# Patient Record
Sex: Male | Born: 1955 | Race: White | Marital: Married | Smoking: Never smoker
Health system: Southern US, Community
[De-identification: ages and names within clinical notes are randomized; demographics above are authoritative.]

## PROBLEM LIST (undated history)

## (undated) DIAGNOSIS — M1990 Unspecified osteoarthritis, unspecified site: Secondary | ICD-10-CM

## (undated) DIAGNOSIS — I10 Essential (primary) hypertension: Secondary | ICD-10-CM

## (undated) DIAGNOSIS — E119 Type 2 diabetes mellitus without complications: Secondary | ICD-10-CM

## (undated) DIAGNOSIS — Z87442 Personal history of urinary calculi: Secondary | ICD-10-CM

## (undated) DIAGNOSIS — I2510 Atherosclerotic heart disease of native coronary artery without angina pectoris: Secondary | ICD-10-CM

## (undated) HISTORY — PX: OTHER SURGICAL HISTORY: SHX169

## (undated) HISTORY — PX: CARDIAC CATHETERIZATION: SHX172

---

## 2017-12-02 ENCOUNTER — Other Ambulatory Visit: Payer: Self-pay | Admitting: Nurse Practitioner

## 2017-12-02 DIAGNOSIS — T1490XA Injury, unspecified, initial encounter: Secondary | ICD-10-CM

## 2017-12-02 DIAGNOSIS — S8991XD Unspecified injury of right lower leg, subsequent encounter: Secondary | ICD-10-CM

## 2017-12-05 ENCOUNTER — Ambulatory Visit
Admission: RE | Admit: 2017-12-05 | Discharge: 2017-12-05 | Disposition: A | Discharge status: Home / Self Care | Payer: Worker's Compensation | Source: Ambulatory Visit | Attending: Nurse Practitioner | Admitting: Nurse Practitioner

## 2017-12-05 DIAGNOSIS — T1490XA Injury, unspecified, initial encounter: Secondary | ICD-10-CM

## 2017-12-05 DIAGNOSIS — S8991XD Unspecified injury of right lower leg, subsequent encounter: Secondary | ICD-10-CM

## 2018-07-17 IMAGING — MR MR KNEE*R* W/O CM
4 of 6 series · 19 of 40 positions shown · non-contrast
Comparison: None.

CLINICAL DATA: Twisting injury of the knee 11/07/2017. Medial knee
pain.

EXAM:
MRI OF THE RIGHT KNEE WITHOUT CONTRAST
TECHNIQUE: Multiplanar, multisequence MR imaging of the knee was performed. No
intravenous contrast was administered.

[Series 3: PD fat-sat · axial · 4.0mm · 0.31mm/px · z∈[-42,+77]mm · 8 of 28 slices shown (1 of 4)]
[im 1/28]
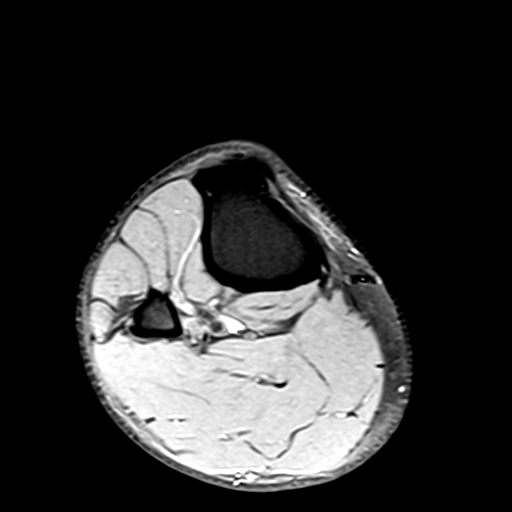
[im 4/28]
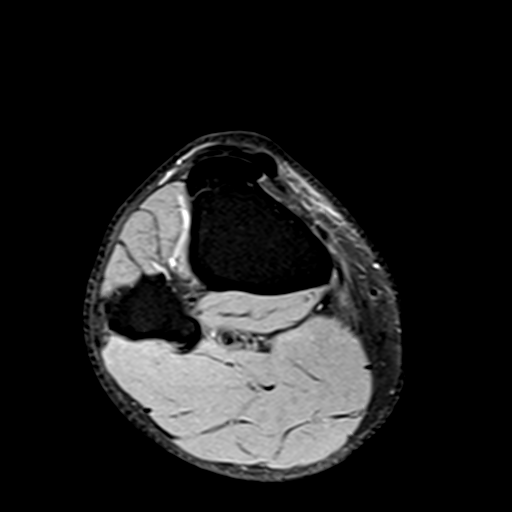
[im 8/28]
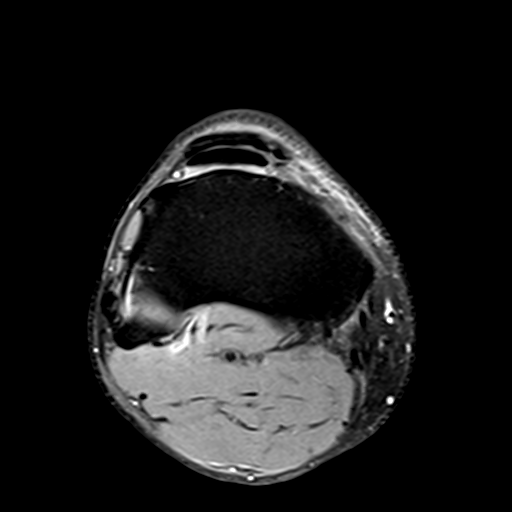
[im 12/28]
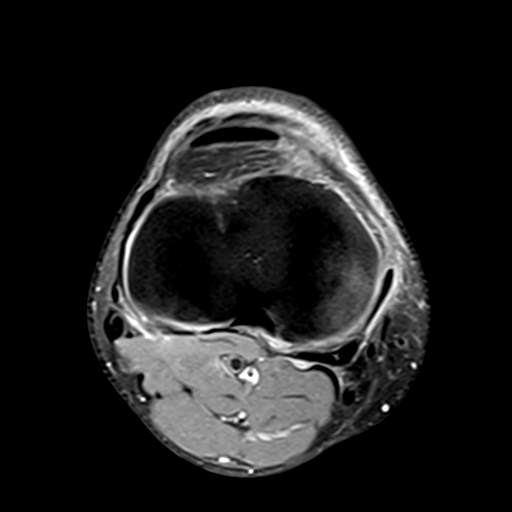
[im 16/28]
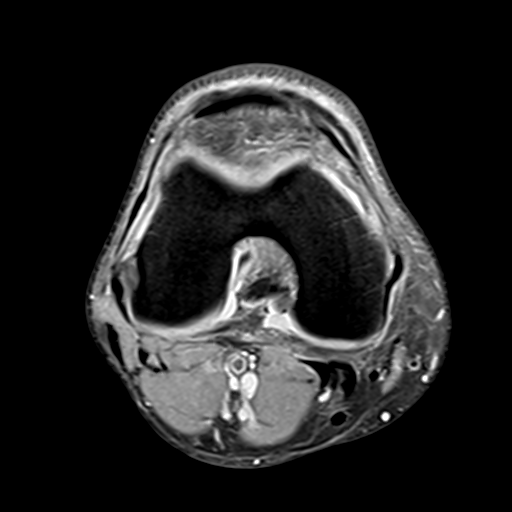
[im 20/28]
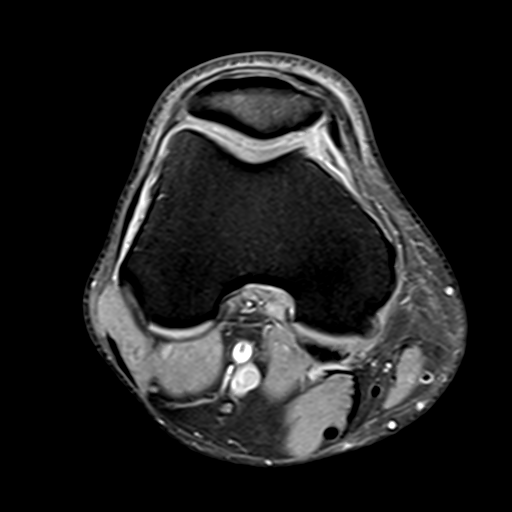
[im 24/28]
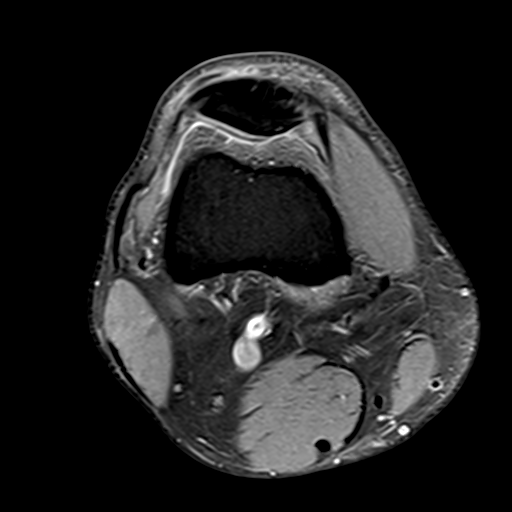
[im 28/28]
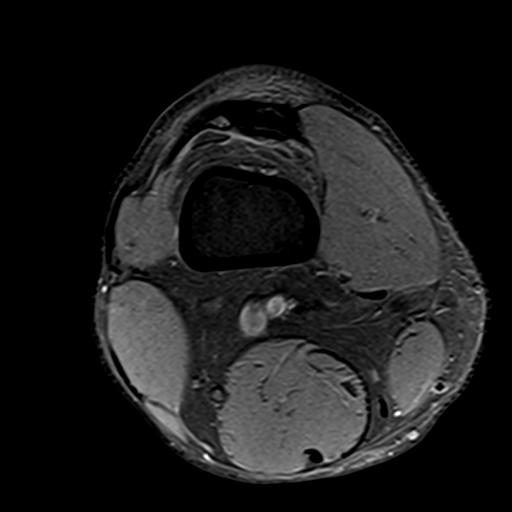

[Series 6: PD fat-sat · coronal · 3.5mm · 0.31mm/px · 5 of 28 slices shown (2 of 4)]
[im 1/28]
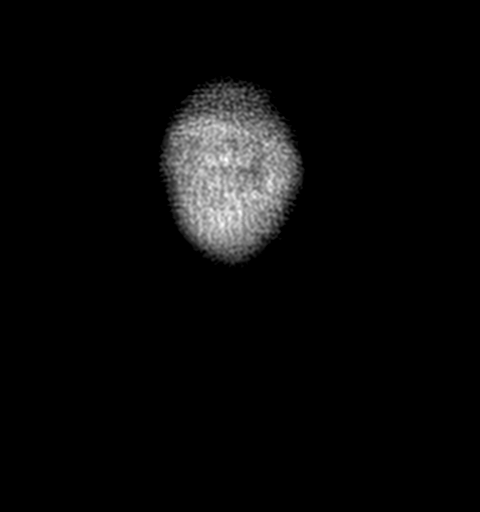
[im 5/28]
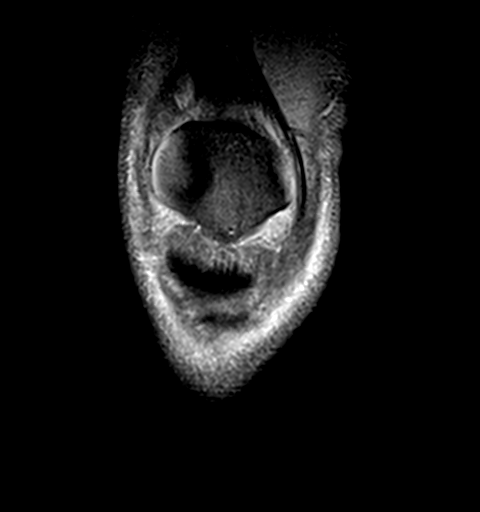
[im 10/28]
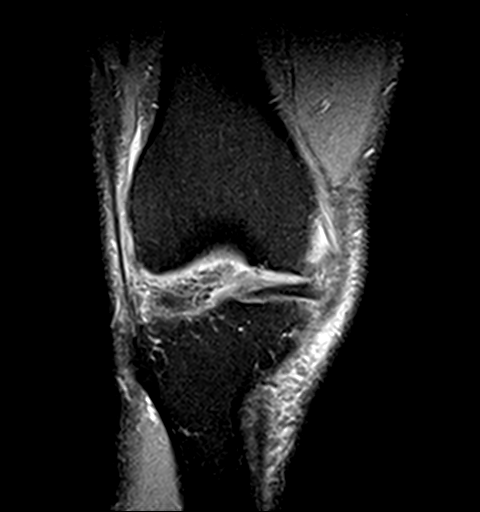
[im 14/28]
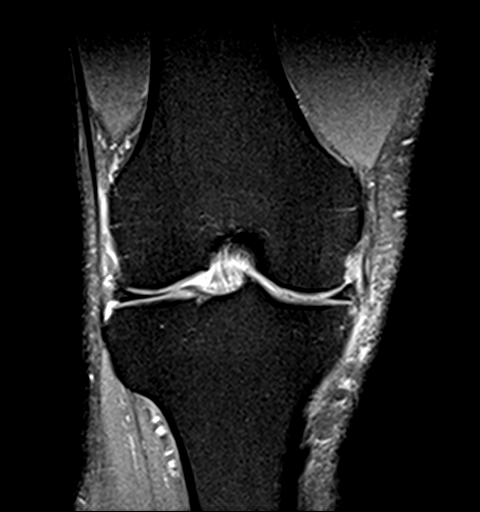
[im 23/28]
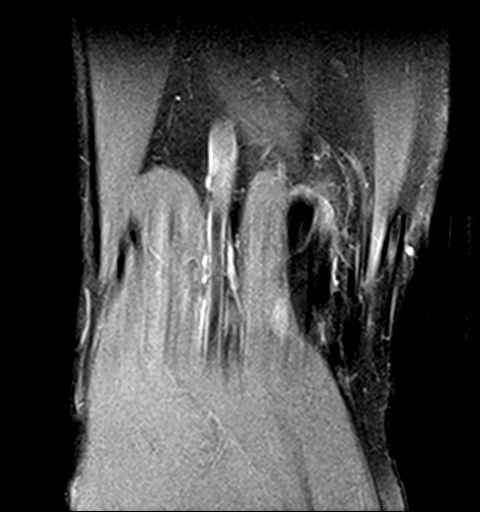

[Series 7: PD fat-sat · sagittal · 3.5mm · 0.31mm/px · 3 of 25 slices shown (3 of 4)]
[im 5/25]
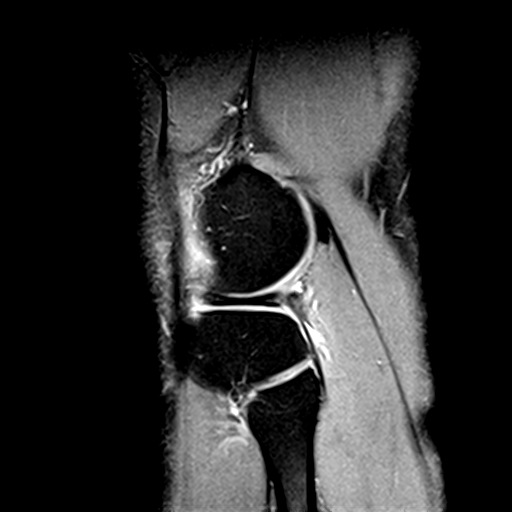
[im 13/25]
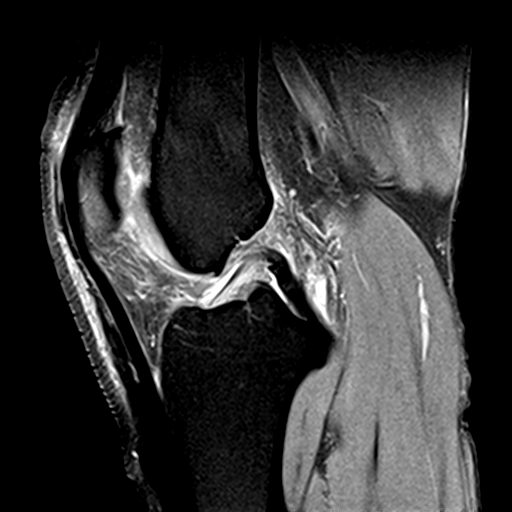
[im 21/25]
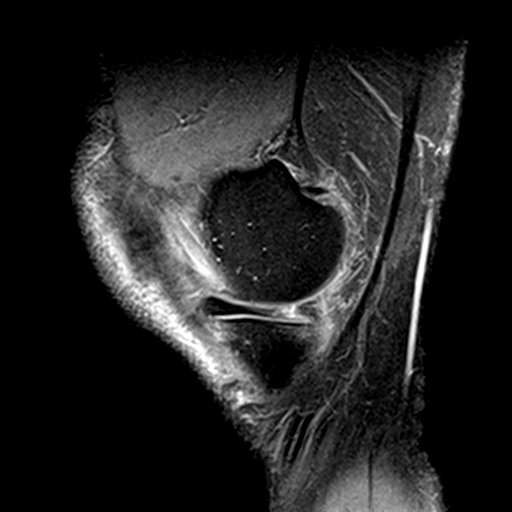

[Series 8: PD fat-sat · coronal · 2.0mm · 0.29mm/px · 3 of 14 slices shown (4 of 4)]
[im 1/14]
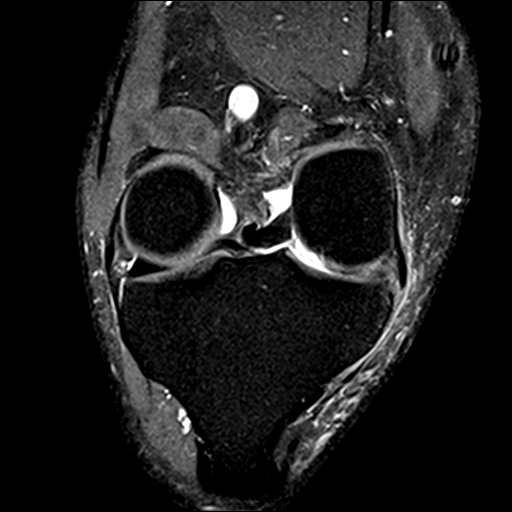
[im 9/14]
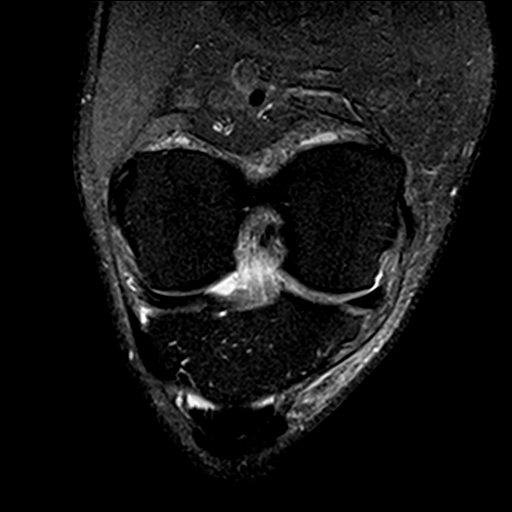
[im 14/14]
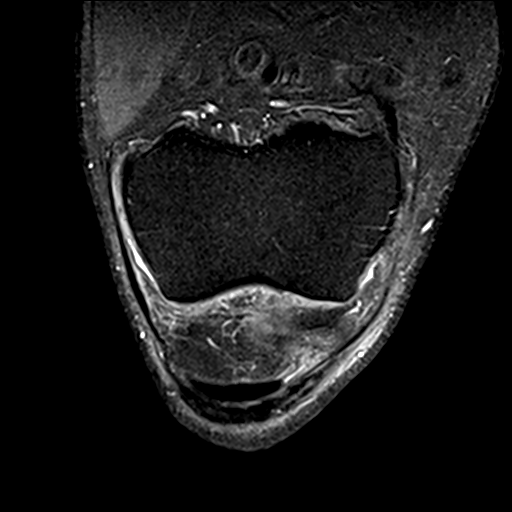

[19 of 40 positions shown; findings below may reference images not displayed]

FINDINGS: MENISCI

Medial meniscus: Complex tear of the posterior horn- body junction
with a radial component. Oblique tear of the posterior horn of the
medial meniscus extending to the inferior articular surface.

Lateral meniscus:  Intact.

LIGAMENTS

Cruciates:  Intact ACL and PCL.

Collaterals: Mild increased signal superficial to the MCL likely
reactive given the meniscal pathology versus mild grade 1 MCL
strain. MCL is otherwise intact. Intact lateral collateral ligament
complex.

CARTILAGE

Patellofemoral: Partial-thickness cartilage loss of the medial
patellofemoral compartment and lateral patellofemoral compartment.

Medial: Partial-thickness cartilage loss of the medial femoral
condyles and medial tibial plateau with areas of full-thickness
cartilage loss along the lateral aspect of the medial femoral
condyle with subchondral reactive marrow changes. Mild subchondral
reactive marrow edema at the periphery of the medial tibial plateau.

Lateral:  No chondral defect.

Joint: No significant joint effusion. Edema in Hoffa's fat. No
plical thickening.

Popliteal Fossa:  Tiny Baker's cyst.  Intact popliteus tendon.

Extensor Mechanism: Intact quadriceps tendon and patellar tendon.
Intact medial and lateral patellar retinaculum. Intact MPFL.

Bones: No acute osseous abnormality. No fracture or dislocation. No
aggressive osseous lesion.

Other: No fluid collection or hematoma.  Muscles are normal.
IMPRESSION: 1. Complex tear of the posterior horn- body junction with a radial
component. Oblique tear of the posterior horn of the medial meniscus
extending to the inferior articular surface.
2. Mild increased signal superficial to the MCL likely reactive
given the meniscal pathology versus mild grade 1 MCL strain. MCL is
otherwise intact.
3. Cartilage abnormalities of the patellofemoral compartment and
medial femorotibial compartment as described above.

## 2020-03-14 NOTE — Progress Notes (Signed)
PCP - Gildardo Griffes   Cardiologist - Tollie Pizza Clearance 02-25-20 care everywhere   Chest x-ray - 04-02-19  [facility] everywhere EKG - 04-02-19 ON chart Stress Test -  ECHO - 03-05-19 care everywhere Cardiac Cath -  HGB a1c 02-16-20 care everywhere    Sleep Study -  CPAP -   Fasting Blood Sugar -  Checks Blood Sugar _____ times a day  Blood Thinner Instructions: Aspirin Instructions: 162 mg sees Dr. Thomasena Edis 3-22 no instructions at preop call  Last Dose:  Anesthesia review: Cardiac stent x1 03-02-2019  Patient denies shortness of breath, fever, cough and chest pain at PAT appointment   NONE   Patient verbalized understanding of instructions that were given to them at the PAT appointment. Patient was also instructed that they will need to review over the PAT instructions again at home before surgery.

## 2020-03-14 NOTE — Patient Instructions (Addendum)
DUE TO COVID-19 ONLY ONE VISITOR IS ALLOWED TO COME WITH YOU AND STAY IN THE WAITING ROOM ONLY DURING PRE OP AND PROCEDURE DAY OF SURGERY. THE 1 VISITOR MAY VISIT WITH YOU AFTER SURGERY IN YOUR PRIVATE ROOM DURING VISITING HOURS ONLY!  10 am -8pm  YOU NEED TO HAVE A COVID 19 TEST ON_3-23-21______ @_2 :15 pm______, THIS TEST MUST BE DONE BEFORE SURGERY, COME  [address], [place] , .  [facility]) ONCE YOUR COVID TEST IS COMPLETED, PLEASE BEGIN THE QUARANTINE INSTRUCTIONS AS OUTLINED IN YOUR HANDOUT.                Robert Esparza  03/14/2020   Your procedure is scheduled on: 03-24-20   Report to [facility] Main  Entrance   Report to admitting at        0800  AM     Call this number if you have problems the morning of surgery [phone]    Remember: Do not eat food or drink liquids :After Midnight.   BRUSH YOUR TEETH MORNING OF SURGERY AND RINSE YOUR MOUTH OUT, NO CHEWING GUM CANDY OR MINTS.     Take these medicines the morning of surgery with A SIP OF WATER: metoprolol, lipitor  DO NOT TAKE ANY DIABETIC MEDICATIONS DAY OF YOUR SURGERY                               You may not have any metal on your body including hair pins and              piercings  Do not wear jewelry, , lotions, powders or perfumes, deodorant              Men may shave face and neck.   Do not bring valuables to the hospital. [facility] IS NOT             RESPONSIBLE   FOR VALUABLES.  Contacts, dentures or bridgework may not be worn into surgery.       Patients discharged the day of surgery will not be allowed to drive home. IF YOU ARE HAVING SURGERY AND GOING HOME THE SAME DAY, YOU MUST HAVE AN ADULT TO DRIVE YOU HOME AND BE WITH YOU FOR 24 HOURS. YOU MAY GO HOME BY TAXI OR UBER OR ORTHERWISE, BUT AN ADULT MUST ACCOMPANY YOU HOME AND STAY WITH YOU FOR 24 HOURS.  Name and phone number of your driver:  Special Instructions: N/A              Please read over the  following fact sheets you were given: _____________________________________________________________________             [facility] - Preparing for Surgery Before surgery, you can play an important role.  Because skin is not sterile, your skin needs to be as free of germs as possible.  You can reduce the number of germs on your skin by washing with CHG (chlorahexidine gluconate) soap before surgery.  CHG is an antiseptic cleaner which kills germs and bonds with the skin to continue killing germs even after washing. Please DO NOT use if you have an allergy to CHG or antibacterial soaps.  If your skin becomes reddened/irritated stop using the CHG and inform your nurse when you arrive at Short Stay. Do not shave (including legs and underarms) for at least 48 hours prior to the first CHG shower.  You  may shave your face/neck. Please follow these instructions carefully:  1.  Shower with CHG Soap the night before surgery and the  morning of Surgery.  2.  If you choose to wash your hair, wash your hair first as usual with your  normal  shampoo.  3.  After you shampoo, rinse your hair and body thoroughly to remove the  shampoo.                           4.  Use CHG as you would any other liquid soap.  You can apply chg directly  to the skin and wash                       Gently with a scrungie or clean washcloth.  5.  Apply the CHG Soap to your body ONLY FROM THE NECK DOWN.   Do not use on face/ open                           Wound or open sores. Avoid contact with eyes, ears mouth and genitals (private parts).                       Wash face,  Genitals (private parts) with your normal soap.             6.  Wash thoroughly, paying special attention to the area where your surgery  will be performed.  7.  Thoroughly rinse your body with warm water from the neck down.  8.  DO NOT shower/wash with your normal soap after using and rinsing off  the CHG Soap.                9.  Pat yourself dry with a clean  towel.            10.  Wear clean pajamas.            11.  Place clean sheets on your bed the night of your first shower and do not  sleep with pets. Day of Surgery : Do not apply any lotions/deodorants the morning of surgery.  Please wear clean clothes to the hospital/surgery center.  FAILURE TO FOLLOW THESE INSTRUCTIONS MAY RESULT IN THE CANCELLATION OF YOUR SURGERY PATIENT SIGNATURE_________________________________  NURSE SIGNATURE__________________________________  ________________________________________________________________________   Robert Esparza  An incentive spirometer is a tool that can help keep your lungs clear and active. This tool measures how well you are filling your lungs with each breath. Taking long deep breaths may help reverse or decrease the chance of developing breathing (pulmonary) problems (especially infection) following:  A long period of time when you are unable to move or be active. BEFORE THE PROCEDURE   If the spirometer includes an indicator to show your best effort, your nurse or respiratory therapist will set it to a desired goal.  If possible, sit up straight or lean slightly forward. Try not to slouch.  Hold the incentive spirometer in an upright position. INSTRUCTIONS FOR USE  1. Sit on the edge of your bed if possible, or sit up as far as you can in bed or on a chair. 2. Hold the incentive spirometer in an upright position. 3. Breathe out normally. 4. Place the mouthpiece in your mouth and seal your lips tightly around it. 5. Breathe in slowly and as deeply as possible, raising the  piston or the ball toward the top of the column. 6. Hold your breath for 3-5 seconds or for as long as possible. Allow the piston or ball to fall to the bottom of the column. 7. Remove the mouthpiece from your mouth and breathe out normally. 8. Rest for a few seconds and repeat Steps 1 through 7 at least 10 times every 1-2 hours when you are awake. Take your  time and take a few normal breaths between deep breaths. 9. The spirometer may include an indicator to show your best effort. Use the indicator as a goal to work toward during each repetition. 10. After each set of 10 deep breaths, practice coughing to be sure your lungs are clear. If you have an incision (the cut made at the time of surgery), support your incision when coughing by placing a pillow or rolled up towels firmly against it. Once you are able to get out of bed, walk around indoors and cough well. You may stop using the incentive spirometer when instructed by your caregiver.  RISKS AND COMPLICATIONS  Take your time so you do not get dizzy or light-headed.  If you are in pain, you may need to take or ask for pain medication before doing incentive spirometry. It is harder to take a deep breath if you are having pain. AFTER USE  Rest and breathe slowly and easily.  It can be helpful to keep track of a log of your progress. Your caregiver can provide you with a simple table to help with this. If you are using the spirometer at home, follow these instructions: [place] IF:   You are having difficultly using the spirometer.  You have trouble using the spirometer as often as instructed.  Your pain medication is not giving enough relief while using the spirometer.  You develop fever of 100.5 F (38.1 C) or higher. SEEK IMMEDIATE MEDICAL CARE IF:   You cough up bloody sputum that had not been present before.  You develop fever of 102 F (38.9 C) or greater.  You develop worsening pain at or near the incision site. MAKE SURE YOU:   Understand these instructions.  Will watch your condition.  Will get help right away if you are not doing well or get worse. Document Released: 04/28/2007 Document Revised: 03/09/2012 Document Reviewed: 06/29/2007 [facility] Patient Information 2014 [place],  [place].   ________________________________________________________________________

## 2020-03-16 ENCOUNTER — Encounter (HOSPITAL_COMMUNITY)
Admission: RE | Admit: 2020-03-16 | Discharge: 2020-03-16 | Disposition: A | Discharge status: Home / Self Care | Payer: 59 | Source: Ambulatory Visit | Attending: Specialist | Admitting: Specialist

## 2020-03-16 ENCOUNTER — Other Ambulatory Visit: Payer: Self-pay

## 2020-03-16 ENCOUNTER — Encounter (HOSPITAL_COMMUNITY): Payer: Self-pay

## 2020-03-16 DIAGNOSIS — Z01812 Encounter for preprocedural laboratory examination: Secondary | ICD-10-CM | POA: Insufficient documentation

## 2020-03-16 HISTORY — DX: Personal history of urinary calculi: Z87.442

## 2020-03-16 HISTORY — DX: Unspecified osteoarthritis, unspecified site: M19.90

## 2020-03-16 HISTORY — DX: Type 2 diabetes mellitus without complications: E11.9

## 2020-03-16 HISTORY — DX: Atherosclerotic heart disease of native coronary artery without angina pectoris: I25.10

## 2020-03-16 HISTORY — DX: Essential (primary) hypertension: I10

## 2020-03-17 ENCOUNTER — Encounter (HOSPITAL_COMMUNITY)
Admission: RE | Admit: 2020-03-17 | Discharge: 2020-03-17 | Disposition: A | Discharge status: Home / Self Care | Payer: 59 | Source: Ambulatory Visit | Attending: Specialist | Admitting: Specialist

## 2020-03-17 DIAGNOSIS — Z01812 Encounter for preprocedural laboratory examination: Secondary | ICD-10-CM | POA: Diagnosis not present

## 2020-03-17 LAB — GLUCOSE, CAPILLARY: Glucose-Capillary: 137 mg/dL — ABNORMAL HIGH (ref 70–99)

## 2020-03-17 LAB — CBC
HCT: 42.6 % (ref 39.0–52.0)
Hemoglobin: 14.9 g/dL (ref 13.0–17.0)
MCH: 32.9 pg (ref 26.0–34.0)
MCHC: 35 g/dL (ref 30.0–36.0)
MCV: 94 fL (ref 80.0–100.0)
Platelets: 217 10*3/uL (ref 150–400)
RBC: 4.53 MIL/uL (ref 4.22–5.81)
RDW: 11.7 % (ref 11.5–15.5)
WBC: 8.3 10*3/uL (ref 4.0–10.5)
nRBC: 0 % (ref 0.0–0.2)

## 2020-03-17 LAB — BASIC METABOLIC PANEL
Anion gap: 11 (ref 5–15)
BUN: 18 mg/dL (ref 8–23)
CO2: 23 mmol/L (ref 22–32)
Calcium: 9.5 mg/dL (ref 8.9–10.3)
Chloride: 101 mmol/L (ref 98–111)
Creatinine, Ser: 0.81 mg/dL (ref 0.61–1.24)
GFR calc Af Amer: >60 mL/min (ref >60)
GFR calc non Af Amer: >60 mL/min (ref >60)
Glucose, Bld: 158 mg/dL — ABNORMAL HIGH (ref 70–99)
Potassium: 4.6 mmol/L (ref 3.5–5.1)
Sodium: 135 mmol/L (ref 135–145)

## 2020-03-17 LAB — SURGICAL PCR SCREEN
MRSA, PCR: NEGATIVE
Staphylococcus aureus: NEGATIVE

## 2020-03-20 NOTE — H&P (Signed)
TOTAL KNEE ADMISSION H&P  Patient is being admitted for left total knee arthroplasty.  Subjective:  Chief Complaint:left knee pain.  HPI: Robert Esparza, 64 y.o. male, has a history of pain and functional disability in the left knee due to arthritis and has failed non-surgical conservative treatments for greater than 12 weeks to includeNSAID's and/or analgesics, corticosteriod injections, viscosupplementation injections, supervised PT with diminished ADL's post treatment and activity modification.  Onset of symptoms was gradual, starting 3 years ago with gradually worsening course since that time. The patient noted prior procedures on the knee to include  arthroscopy and menisectomy on the left knee(s).  Patient currently rates pain in the left knee(s) at 5 out of 10 with activity. Patient has night pain, worsening of pain with activity and weight bearing and pain that interferes with activities of daily living.  Patient has evidence of periarticular osteophytes and joint space narrowing by imaging studies. This patient has had no previous injury. There is no active infection.  There are no problems to display for this patient.  Past Medical History:  Diagnosis Date  . Arthritis   . Coronary artery disease    Cardiac stent x1  . Diabetes mellitus without complication ([place])    type 2  . History of kidney stones   . Hypertension     Past Surgical History:  Procedure Laterality Date  . arthrocscopy     right knee  . CARDIAC CATHETERIZATION     stent x1  . kidney stone removal      No current facility-administered medications for this encounter.   Current Outpatient Medications  Medication Sig Dispense Refill Last Dose  . aspirin EC 81 MG tablet Take 162 mg by mouth daily.     Marland Kitchen atorvastatin (LIPITOR) 80 MG tablet Take 80 mg by mouth daily.     Marland Kitchen lisinopril (ZESTRIL) 40 MG tablet Take 40 mg by mouth daily.     . metFORMIN (GLUCOPHAGE) 500 MG tablet Take 1,000 mg by mouth 2 (two)  times daily with a meal.      . metoprolol succinate (TOPROL-XL) 25 MG 24 hr tablet Take 25 mg by mouth daily.      No Known Allergies  Social History   Tobacco Use  . Smoking status: Never Smoker  . Smokeless tobacco: Never Used  Substance Use Topics  . Alcohol use: Yes    Alcohol/week: 1.0 standard drinks    Types: 1 Cans of beer per week    Comment: occasional    No family history on file.   Review of Systems  Constitutional: Negative.   HENT: Negative.   Eyes: Negative.   Respiratory: Negative.   Cardiovascular: Negative.   Gastrointestinal: Negative.   Endocrine: Negative.   Genitourinary: Negative.   Musculoskeletal: Positive for arthralgias.  Skin: Negative.   Allergic/Immunologic: Negative.   Neurological: Negative.   Hematological: Negative.   Psychiatric/Behavioral: Negative.     Objective:  Physical Exam  Constitutional: He is oriented to person, place, and time. He appears well-developed and well-nourished. No distress.  HENT:  Head: Normocephalic and atraumatic.  Neck: No JVD present. No tracheal deviation present. No thyromegaly present.  Cardiovascular: Normal rate, regular rhythm, normal heart sounds and intact distal pulses. Exam reveals no gallop and no friction rub.  No murmur heard. Respiratory: Effort normal and breath sounds normal. No stridor. No respiratory distress. He has no wheezes. He has no rales. He exhibits no tenderness.  Musculoskeletal:     Cervical back: Normal  range of motion and neck supple.  Lymphadenopathy:    He has no cervical adenopathy.  Neurological: He is alert and oriented to person, place, and time. No cranial nerve deficit. Coordination normal.  Skin: Skin is warm and dry. No rash noted. He is not diaphoretic. No erythema. No pallor.  Psychiatric: He has a normal mood and affect. His behavior is normal. Judgment and thought content normal.    Vital signs in last 24 hours:    Labs:   Estimated body mass index is  32.65 kg/m as calculated from the following:   Height as of 03/17/20: 5\' 6"  (1.676 m).   Weight as of 03/17/20: 91.8 kg.   Imaging Review Plain radiographs demonstrate moderate degenerative joint disease of the left knee(s). The overall alignment ismild varus. The bone quality appears to be good for age and reported activity level.      Assessment/Plan:  End stage arthritis, left knee   The patient history, physical examination, clinical judgment of the provider and imaging studies are consistent with end stage degenerative joint disease of the left knee(s) and total knee arthroplasty is deemed medically necessary. The treatment options including medical management, injection therapy arthroscopy and arthroplasty were discussed at length. The risks and benefits of total knee arthroplasty were presented and reviewed. The risks due to aseptic loosening, infection, stiffness, patella tracking problems, thromboembolic complications and other imponderables were discussed. The patient acknowledged the explanation, agreed to proceed with the plan and consent was signed. Patient is being admitted for inpatient treatment for surgery, pain control, PT, OT, prophylactic antibiotics, VTE prophylaxis, progressive ambulation and ADL's and discharge planning. The patient is planning to be discharged home with home health services    Anticipated LOS equal to or greater than 2 midnights due to - Age 61 and older with one or more of the following:  - Obesity  - Expected need for hospital services (PT, OT, Nursing) required for safe  discharge  - Anticipated need for postoperative skilled nursing care or inpatient rehab  - Active co-morbidities: Diabetes OR   - Unanticipated findings during/Post Surgery: None  - Patient is a high risk of re-admission due to: None

## 2020-03-21 ENCOUNTER — Other Ambulatory Visit (HOSPITAL_COMMUNITY)
Admission: RE | Admit: 2020-03-21 | Discharge: 2020-03-21 | Disposition: A | Discharge status: Home / Self Care | Payer: 59 | Source: Ambulatory Visit | Attending: Specialist | Admitting: Specialist

## 2020-03-21 LAB — SARS CORONAVIRUS 2 (TAT 6-24 HRS): SARS Coronavirus 2: NEGATIVE

## 2020-03-23 MED ORDER — BUPIVACAINE LIPOSOME 1.3 % IJ SUSP
20.0000 mL | Freq: Once | INTRAMUSCULAR | Status: DC
  Filled 2020-03-23: qty 20

## 2020-03-23 NOTE — Anesthesia Preprocedure Evaluation (Addendum)
Anesthesia Evaluation  Patient identified by MRN, date of birth, ID band Patient awake    Reviewed: Allergy & Precautions, NPO status , Patient's Chart, lab work & pertinent test results, reviewed documented beta blocker date and time   History of Anesthesia Complications Negative for: history of anesthetic complications  Airway Mallampati: II  TM Distance: >3 FB Neck ROM: Full    Dental no notable dental hx.    Pulmonary neg pulmonary ROS,    Pulmonary exam normal        Cardiovascular hypertension, Pt. on medications and Pt. on home beta blockers + CAD and + Cardiac Stents  Normal cardiovascular exam     Neuro/Psych negative neurological ROS  negative psych ROS   GI/Hepatic negative GI ROS, Neg liver ROS,   Endo/Other  diabetes, Type 2, Oral Hypoglycemic Agents  Renal/GU negative Renal ROS  negative genitourinary   Musculoskeletal  (+) Arthritis ,   Abdominal   Peds  Hematology negative hematology ROS (+)   Anesthesia Other Findings Day of surgery medications reviewed with patient.  Reproductive/Obstetrics negative OB ROS                            Anesthesia Physical Anesthesia Plan  ASA: III  Anesthesia Plan: Spinal   Post-op Pain Management:  Regional for Post-op pain   Induction:   PONV Risk Score and Plan: 2 and Treatment may vary due to age or medical condition, Ondansetron, Propofol infusion and Dexamethasone  Airway Management Planned: Natural Airway and Simple Face Mask  Additional Equipment: None  Intra-op Plan:   Post-operative Plan:   Informed Consent: I have reviewed the patients History and Physical, chart, labs and discussed the procedure including the risks, benefits and alternatives for the proposed anesthesia with the patient or authorized representative who has indicated his/her understanding and acceptance.       Plan Discussed with:  CRNA  Anesthesia Plan Comments:        Anesthesia Quick Evaluation

## 2020-03-24 ENCOUNTER — Inpatient Hospital Stay (HOSPITAL_COMMUNITY): Payer: 59 | Admitting: Physician Assistant

## 2020-03-24 ENCOUNTER — Inpatient Hospital Stay (HOSPITAL_COMMUNITY): Payer: 59 | Admitting: Certified Registered"

## 2020-03-24 ENCOUNTER — Encounter (HOSPITAL_COMMUNITY)
Admission: RE | Disposition: A | Discharge status: Home / Self Care | Payer: Self-pay | Source: Home / Self Care | Attending: Specialist

## 2020-03-24 ENCOUNTER — Other Ambulatory Visit: Payer: Self-pay

## 2020-03-24 ENCOUNTER — Inpatient Hospital Stay (HOSPITAL_COMMUNITY)
Admission: RE | Admit: 2020-03-24 | Discharge: 2020-03-25 | DRG: 470 | Disposition: A | Discharge status: Home Health | Payer: 59 | Attending: Specialist | Admitting: Specialist

## 2020-03-24 ENCOUNTER — Encounter (HOSPITAL_COMMUNITY): Payer: Self-pay | Admitting: Specialist

## 2020-03-24 DIAGNOSIS — Z79899 Other long term (current) drug therapy: Secondary | ICD-10-CM

## 2020-03-24 DIAGNOSIS — I10 Essential (primary) hypertension: Secondary | ICD-10-CM | POA: Diagnosis present

## 2020-03-24 DIAGNOSIS — E669 Obesity, unspecified: Secondary | ICD-10-CM | POA: Diagnosis present

## 2020-03-24 DIAGNOSIS — Z955 Presence of coronary angioplasty implant and graft: Secondary | ICD-10-CM

## 2020-03-24 DIAGNOSIS — Z6832 Body mass index (BMI) 32.0-32.9, adult: Secondary | ICD-10-CM

## 2020-03-24 DIAGNOSIS — M1712 Unilateral primary osteoarthritis, left knee: Principal | ICD-10-CM | POA: Diagnosis present

## 2020-03-24 DIAGNOSIS — E119 Type 2 diabetes mellitus without complications: Secondary | ICD-10-CM | POA: Diagnosis present

## 2020-03-24 DIAGNOSIS — I2510 Atherosclerotic heart disease of native coronary artery without angina pectoris: Secondary | ICD-10-CM | POA: Diagnosis present

## 2020-03-24 DIAGNOSIS — Z7982 Long term (current) use of aspirin: Secondary | ICD-10-CM

## 2020-03-24 DIAGNOSIS — Z7984 Long term (current) use of oral hypoglycemic drugs: Secondary | ICD-10-CM | POA: Diagnosis not present

## 2020-03-24 DIAGNOSIS — Z20822 Contact with and (suspected) exposure to covid-19: Secondary | ICD-10-CM | POA: Diagnosis present

## 2020-03-24 HISTORY — PX: TOTAL KNEE ARTHROPLASTY: SHX125

## 2020-03-24 LAB — COMPREHENSIVE METABOLIC PANEL
ALT: 34 U/L (ref 0–44)
AST: 24 U/L (ref 15–41)
Albumin: 3.7 g/dL (ref 3.5–5.0)
Alkaline Phosphatase: 29 U/L — ABNORMAL LOW (ref 38–126)
Anion gap: 8 (ref 5–15)
BUN: 19 mg/dL (ref 8–23)
CO2: 26 mmol/L (ref 22–32)
Calcium: 8.9 mg/dL (ref 8.9–10.3)
Chloride: 101 mmol/L (ref 98–111)
Creatinine, Ser: 0.96 mg/dL (ref 0.61–1.24)
GFR calc Af Amer: >60 mL/min (ref >60)
GFR calc non Af Amer: >60 mL/min (ref >60)
Glucose, Bld: 175 mg/dL — ABNORMAL HIGH (ref 70–99)
Potassium: 5.8 mmol/L — ABNORMAL HIGH (ref 3.5–5.1)
Sodium: 135 mmol/L (ref 135–145)
Total Bilirubin: 1.2 mg/dL (ref 0.3–1.2)
Total Protein: 6.5 g/dL (ref 6.5–8.1)

## 2020-03-24 LAB — CREATININE, SERUM
Creatinine, Ser: 0.95 mg/dL (ref 0.61–1.24)
GFR calc Af Amer: >60 mL/min (ref >60)
GFR calc non Af Amer: >60 mL/min (ref >60)

## 2020-03-24 LAB — ABO/RH: ABO/RH(D): AB NEG

## 2020-03-24 LAB — CBC
HCT: 39.5 % (ref 39.0–52.0)
Hemoglobin: 13.6 g/dL (ref 13.0–17.0)
MCH: 32.5 pg (ref 26.0–34.0)
MCHC: 34.4 g/dL (ref 30.0–36.0)
MCV: 94.3 fL (ref 80.0–100.0)
Platelets: 198 10*3/uL (ref 150–400)
RBC: 4.19 MIL/uL — ABNORMAL LOW (ref 4.22–5.81)
RDW: 11.6 % (ref 11.5–15.5)
WBC: 8.1 10*3/uL (ref 4.0–10.5)
nRBC: 0 % (ref 0.0–0.2)

## 2020-03-24 LAB — GLUCOSE, CAPILLARY
Glucose-Capillary: 166 mg/dL — ABNORMAL HIGH (ref 70–99)
Glucose-Capillary: 159 mg/dL — ABNORMAL HIGH (ref 70–99)
Glucose-Capillary: 269 mg/dL — ABNORMAL HIGH (ref 70–99)
Glucose-Capillary: 250 mg/dL — ABNORMAL HIGH (ref 70–99)

## 2020-03-24 LAB — PROTIME-INR
INR: 0.9 (ref 0.8–1.2)
Prothrombin Time: 12.5 seconds (ref 11.4–15.2)

## 2020-03-24 LAB — APTT: aPTT: 26 seconds (ref 24–36)

## 2020-03-24 LAB — HIV ANTIBODY (ROUTINE TESTING W REFLEX): HIV Screen 4th Generation wRfx: NONREACTIVE

## 2020-03-24 LAB — TYPE AND SCREEN
ABO/RH(D): AB NEG
Antibody Screen: NEGATIVE

## 2020-03-24 SURGERY — ARTHROPLASTY, KNEE, TOTAL
Anesthesia: Spinal | Site: Knee | Laterality: Left

## 2020-03-24 MED ORDER — LIDOCAINE 2% (20 MG/ML) 5 ML SYRINGE
INTRAMUSCULAR | Status: DC | PRN
  Administered 2020-03-24: 40 mg via INTRAVENOUS

## 2020-03-24 MED ORDER — PROMETHAZINE HCL 25 MG/ML IJ SOLN: 6.2500 mg | INTRAMUSCULAR | Status: DC | PRN

## 2020-03-24 MED ORDER — ATORVASTATIN CALCIUM 80 MG PO TABS
80.0000 mg | ORAL_TABLET | Freq: Every day | ORAL | Status: DC
  Administered 2020-03-24 – 2020-03-25 (×2): 80 mg via ORAL
  Filled 2020-03-24: qty 1
  Filled 2020-03-24 (×2): qty 2
  Filled 2020-03-24: qty 1

## 2020-03-24 MED ORDER — PHENYLEPHRINE 40 MCG/ML (10ML) SYRINGE FOR IV PUSH (FOR BLOOD PRESSURE SUPPORT)
PREFILLED_SYRINGE | INTRAVENOUS | Status: DC | PRN
  Administered 2020-03-24: 40 ug via INTRAVENOUS
  Administered 2020-03-24 (×2): 80 ug via INTRAVENOUS

## 2020-03-24 MED ORDER — DEXAMETHASONE SODIUM PHOSPHATE 10 MG/ML IJ SOLN
INTRAMUSCULAR | Status: AC
  Filled 2020-03-24: qty 1

## 2020-03-24 MED ORDER — 0.9 % SODIUM CHLORIDE (POUR BTL) OPTIME
TOPICAL | Status: DC | PRN
  Administered 2020-03-24: 1000 mL

## 2020-03-24 MED ORDER — FENTANYL CITRATE (PF) 100 MCG/2ML IJ SOLN: 25.0000 ug | INTRAMUSCULAR | Status: DC | PRN

## 2020-03-24 MED ORDER — PHENYLEPHRINE HCL-NACL 10-0.9 MG/250ML-% IV SOLN
INTRAVENOUS | Status: DC | PRN
  Administered 2020-03-24: 20 ug/min via INTRAVENOUS

## 2020-03-24 MED ORDER — CHLORHEXIDINE GLUCONATE 4 % EX LIQD: 60.0000 mL | Freq: Once | CUTANEOUS | Status: DC

## 2020-03-24 MED ORDER — BUPIVACAINE-EPINEPHRINE (PF) 0.5% -1:200000 IJ SOLN
INTRAMUSCULAR | Status: DC | PRN
  Administered 2020-03-24: 15 mL via PERINEURAL

## 2020-03-24 MED ORDER — MIDAZOLAM HCL 2 MG/2ML IJ SOLN
INTRAMUSCULAR | Status: DC | PRN
  Administered 2020-03-24 (×2): 1 mg via INTRAVENOUS

## 2020-03-24 MED ORDER — SODIUM CHLORIDE 0.9 % IR SOLN
Status: DC | PRN
  Administered 2020-03-24: 1

## 2020-03-24 MED ORDER — PROPOFOL 500 MG/50ML IV EMUL
INTRAVENOUS | Status: DC | PRN
  Administered 2020-03-24: 100 ug/kg/min via INTRAVENOUS

## 2020-03-24 MED ORDER — POVIDONE-IODINE 10 % EX SWAB
2.0000 "application " | Freq: Once | CUTANEOUS | Status: AC
  Administered 2020-03-24: 2 via TOPICAL

## 2020-03-24 MED ORDER — MIDAZOLAM HCL 2 MG/2ML IJ SOLN
INTRAMUSCULAR | Status: AC
  Filled 2020-03-24: qty 2

## 2020-03-24 MED ORDER — CEFAZOLIN SODIUM-DEXTROSE 1-4 GM/50ML-% IV SOLN
1.0000 g | Freq: Three times a day (TID) | INTRAVENOUS | Status: DC
  Administered 2020-03-24 – 2020-03-25 (×2): 1 g via INTRAVENOUS
  Filled 2020-03-24 (×2): qty 50

## 2020-03-24 MED ORDER — METHOCARBAMOL 500 MG PO TABS
500.0000 mg | ORAL_TABLET | Freq: Four times a day (QID) | ORAL | Status: DC | PRN
  Administered 2020-03-25: 500 mg via ORAL
  Filled 2020-03-24: qty 1

## 2020-03-24 MED ORDER — SODIUM CHLORIDE (PF) 0.9 % IJ SOLN
INTRAMUSCULAR | Status: AC
  Filled 2020-03-24: qty 10

## 2020-03-24 MED ORDER — METFORMIN HCL 500 MG PO TABS
1000.0000 mg | ORAL_TABLET | Freq: Two times a day (BID) | ORAL | Status: DC
  Administered 2020-03-25: 1000 mg via ORAL
  Filled 2020-03-24: qty 2

## 2020-03-24 MED ORDER — METHOCARBAMOL 500 MG IVPB - SIMPLE MED
500.0000 mg | Freq: Four times a day (QID) | INTRAVENOUS | Status: DC | PRN
  Filled 2020-03-24: qty 50

## 2020-03-24 MED ORDER — OXYCODONE HCL 5 MG PO TABS
5.0000 mg | ORAL_TABLET | ORAL | Status: DC | PRN
  Administered 2020-03-24: 10 mg via ORAL
  Filled 2020-03-24: qty 1
  Filled 2020-03-24 (×2): qty 2

## 2020-03-24 MED ORDER — DEXAMETHASONE SODIUM PHOSPHATE 10 MG/ML IJ SOLN
8.0000 mg | Freq: Once | INTRAMUSCULAR | Status: AC
  Administered 2020-03-24: 11:00:00 8 mg via INTRAVENOUS

## 2020-03-24 MED ORDER — ONDANSETRON HCL 4 MG PO TABS: 4.0000 mg | ORAL_TABLET | Freq: Every day | ORAL | 1 refills | Status: AC | PRN

## 2020-03-24 MED ORDER — GABAPENTIN 300 MG PO CAPS
300.0000 mg | ORAL_CAPSULE | Freq: Three times a day (TID) | ORAL | Status: DC
  Administered 2020-03-24 – 2020-03-25 (×3): 300 mg via ORAL
  Filled 2020-03-24 (×3): qty 1

## 2020-03-24 MED ORDER — ONDANSETRON HCL 4 MG/2ML IJ SOLN: 4.0000 mg | Freq: Four times a day (QID) | INTRAMUSCULAR | Status: DC | PRN

## 2020-03-24 MED ORDER — DIPHENHYDRAMINE HCL 12.5 MG/5ML PO ELIX: 12.5000 mg | ORAL_SOLUTION | ORAL | Status: DC | PRN

## 2020-03-24 MED ORDER — LISINOPRIL 20 MG PO TABS
40.0000 mg | ORAL_TABLET | Freq: Every day | ORAL | Status: DC
  Administered 2020-03-25: 40 mg via ORAL
  Filled 2020-03-24: qty 2

## 2020-03-24 MED ORDER — MIDAZOLAM HCL 2 MG/2ML IJ SOLN
1.0000 mg | INTRAMUSCULAR | Status: DC
  Administered 2020-03-24: 1 mg via INTRAVENOUS
  Filled 2020-03-24: qty 2

## 2020-03-24 MED ORDER — LIDOCAINE 2% (20 MG/ML) 5 ML SYRINGE
INTRAMUSCULAR | Status: AC
  Filled 2020-03-24: qty 5

## 2020-03-24 MED ORDER — OXYCODONE HCL 5 MG/5ML PO SOLN: 5.0000 mg | Freq: Once | ORAL | Status: DC | PRN

## 2020-03-24 MED ORDER — BUPIVACAINE IN DEXTROSE 0.75-8.25 % IT SOLN
INTRATHECAL | Status: DC | PRN
  Administered 2020-03-24: 1.6 mL via INTRATHECAL

## 2020-03-24 MED ORDER — SODIUM CHLORIDE 0.9 % IV SOLN: INTRAVENOUS | Status: DC

## 2020-03-24 MED ORDER — LACTATED RINGERS IV SOLN: INTRAVENOUS | Status: DC

## 2020-03-24 MED ORDER — ONDANSETRON HCL 4 MG/2ML IJ SOLN
INTRAMUSCULAR | Status: AC
  Filled 2020-03-24: qty 2

## 2020-03-24 MED ORDER — ONDANSETRON HCL 4 MG PO TABS: 4.0000 mg | ORAL_TABLET | Freq: Four times a day (QID) | ORAL | Status: DC | PRN

## 2020-03-24 MED ORDER — PROPOFOL 1000 MG/100ML IV EMUL
INTRAVENOUS | Status: AC
  Filled 2020-03-24: qty 100

## 2020-03-24 MED ORDER — OXYCODONE HCL 5 MG PO TABS: 5.0000 mg | ORAL_TABLET | Freq: Once | ORAL | Status: DC | PRN

## 2020-03-24 MED ORDER — MAGNESIUM CITRATE PO SOLN: 1.0000 | Freq: Once | ORAL | Status: DC | PRN

## 2020-03-24 MED ORDER — FENTANYL CITRATE (PF) 100 MCG/2ML IJ SOLN
50.0000 ug | INTRAMUSCULAR | Status: DC
  Administered 2020-03-24: 50 ug via INTRAVENOUS
  Filled 2020-03-24: qty 2

## 2020-03-24 MED ORDER — TRANEXAMIC ACID-NACL 1000-0.7 MG/100ML-% IV SOLN
1000.0000 mg | INTRAVENOUS | Status: AC
  Administered 2020-03-24: 1000 mg via INTRAVENOUS
  Filled 2020-03-24: qty 100

## 2020-03-24 MED ORDER — PROPOFOL 10 MG/ML IV BOLUS
INTRAVENOUS | Status: AC
  Filled 2020-03-24: qty 20

## 2020-03-24 MED ORDER — METHOCARBAMOL 500 MG PO TABS: 500.0000 mg | ORAL_TABLET | Freq: Four times a day (QID) | ORAL | 0 refills | Status: AC

## 2020-03-24 MED ORDER — HYDROMORPHONE HCL 1 MG/ML IJ SOLN: 0.5000 mg | INTRAMUSCULAR | Status: DC | PRN

## 2020-03-24 MED ORDER — PROPOFOL 10 MG/ML IV BOLUS
INTRAVENOUS | Status: DC | PRN
  Administered 2020-03-24: 20 mg via INTRAVENOUS

## 2020-03-24 MED ORDER — METOPROLOL SUCCINATE ER 25 MG PO TB24
25.0000 mg | ORAL_TABLET | Freq: Every day | ORAL | Status: DC
  Administered 2020-03-25: 25 mg via ORAL
  Filled 2020-03-24: qty 1

## 2020-03-24 MED ORDER — OXYCODONE HCL 5 MG PO TABS: 5.0000 mg | ORAL_TABLET | Freq: Three times a day (TID) | ORAL | 0 refills | Status: AC | PRN

## 2020-03-24 MED ORDER — ACETAMINOPHEN 325 MG PO TABS: 325.0000 mg | ORAL_TABLET | Freq: Four times a day (QID) | ORAL | Status: DC | PRN

## 2020-03-24 MED ORDER — SODIUM CHLORIDE 0.9 % IV SOLN
INTRAVENOUS | Status: DC | PRN
  Administered 2020-03-24: 80 mL

## 2020-03-24 MED ORDER — SODIUM CHLORIDE (PF) 0.9 % IJ SOLN
INTRAMUSCULAR | Status: AC
  Filled 2020-03-24: qty 50

## 2020-03-24 MED ORDER — ACETAMINOPHEN 500 MG PO TABS
1000.0000 mg | ORAL_TABLET | Freq: Four times a day (QID) | ORAL | Status: AC
  Administered 2020-03-24 – 2020-03-25 (×4): 1000 mg via ORAL
  Filled 2020-03-24 (×4): qty 2

## 2020-03-24 MED ORDER — SENNOSIDES-DOCUSATE SODIUM 8.6-50 MG PO TABS: 1.0000 | ORAL_TABLET | Freq: Every evening | ORAL | Status: DC | PRN

## 2020-03-24 MED ORDER — BISACODYL 5 MG PO TBEC: 5.0000 mg | DELAYED_RELEASE_TABLET | Freq: Every day | ORAL | Status: DC | PRN

## 2020-03-24 MED ORDER — CEFAZOLIN SODIUM-DEXTROSE 2-4 GM/100ML-% IV SOLN
2.0000 g | INTRAVENOUS | Status: AC
  Administered 2020-03-24: 2 g via INTRAVENOUS
  Filled 2020-03-24: qty 100

## 2020-03-24 MED ORDER — ACETAMINOPHEN 500 MG PO TABS
1000.0000 mg | ORAL_TABLET | Freq: Once | ORAL | Status: AC
  Administered 2020-03-24: 1000 mg via ORAL
  Filled 2020-03-24: qty 2

## 2020-03-24 MED ORDER — TRAMADOL HCL 50 MG PO TABS
50.0000 mg | ORAL_TABLET | Freq: Four times a day (QID) | ORAL | Status: DC
  Administered 2020-03-24 – 2020-03-25 (×3): 50 mg via ORAL
  Filled 2020-03-24 (×4): qty 1

## 2020-03-24 MED ORDER — OXYCODONE HCL 5 MG PO TABS
10.0000 mg | ORAL_TABLET | ORAL | Status: DC | PRN
  Administered 2020-03-25: 10 mg via ORAL

## 2020-03-24 MED ORDER — ENOXAPARIN SODIUM 40 MG/0.4ML ~~LOC~~ SOLN
40.0000 mg | SUBCUTANEOUS | Status: DC
  Administered 2020-03-25: 40 mg via SUBCUTANEOUS
  Filled 2020-03-24: qty 0.4

## 2020-03-24 SURGICAL SUPPLY — 70 items
ATTUNE PS FEM LT SZ 6 CEM KNEE (Femur) ×3 IMPLANT
ATTUNE PSRP INSR SZ6 6 KNEE (Insert) ×2 IMPLANT
ATTUNE PSRP INSR SZ6 6MM KNEE (Insert) ×1 IMPLANT
BAG ZIPLOCK 12X15 (MISCELLANEOUS) ×6 IMPLANT
BASE TIBIAL ROT PLAT SZ 7 KNEE (Knees) ×1 IMPLANT
BLADE SAG 18X100X1.27 (BLADE) ×3 IMPLANT
BLADE SAW SGTL 11.0X1.19X90.0M (BLADE) ×3 IMPLANT
BNDG ELASTIC 4X5.8 VLCR STR LF (GAUZE/BANDAGES/DRESSINGS) ×3 IMPLANT
BNDG ELASTIC 6X10 VLCR STRL LF (GAUZE/BANDAGES/DRESSINGS) ×3 IMPLANT
BNDG ELASTIC 6X5.8 VLCR STR LF (GAUZE/BANDAGES/DRESSINGS) ×3 IMPLANT
BONE CEMENT GENTAMICIN (Cement) ×6 IMPLANT
BOWL SMART MIX CTS (DISPOSABLE) ×3 IMPLANT
CEMENT BONE GENTAMICIN 40 (Cement) ×2 IMPLANT
COVER SURGICAL LIGHT HANDLE (MISCELLANEOUS) ×3 IMPLANT
COVER WAND RF STERILE (DRAPES) IMPLANT
CUFF TOURN SGL QUICK 34 (TOURNIQUET CUFF) ×2
CUFF TRNQT CYL 34X4.125X (TOURNIQUET CUFF) ×1 IMPLANT
DECANTER SPIKE VIAL GLASS SM (MISCELLANEOUS) ×3 IMPLANT
DERMABOND ADVANCED (GAUZE/BANDAGES/DRESSINGS) ×2
DERMABOND ADVANCED .7 DNX12 (GAUZE/BANDAGES/DRESSINGS) ×1 IMPLANT
DRAPE U-SHAPE 47X51 STRL (DRAPES) ×3 IMPLANT
DRESSING AQUACEL AG SP 3.5X10 (GAUZE/BANDAGES/DRESSINGS) ×1 IMPLANT
DRSG AQUACEL AG ADV 3.5X10 (GAUZE/BANDAGES/DRESSINGS) ×3 IMPLANT
DRSG AQUACEL AG SP 3.5X10 (GAUZE/BANDAGES/DRESSINGS) ×3
DRSG TEGADERM 4X4.75 (GAUZE/BANDAGES/DRESSINGS) ×3 IMPLANT
DURAPREP 26ML APPLICATOR (WOUND CARE) ×6 IMPLANT
ELECT REM PT RETURN 15FT ADLT (MISCELLANEOUS) ×3 IMPLANT
EVACUATOR 1/8 PVC DRAIN (DRAIN) ×3 IMPLANT
GAUZE SPONGE 2X2 8PLY STRL LF (GAUZE/BANDAGES/DRESSINGS) ×1 IMPLANT
GLOVE BIOGEL PI IND STRL 7.5 (GLOVE) ×1 IMPLANT
GLOVE BIOGEL PI IND STRL 8 (GLOVE) ×1 IMPLANT
GLOVE BIOGEL PI INDICATOR 7.5 (GLOVE) ×2
GLOVE BIOGEL PI INDICATOR 8 (GLOVE) ×2
GLOVE ECLIPSE 8.0 STRL XLNG CF (GLOVE) ×3 IMPLANT
GLOVE SURG ORTHO 9.0 STRL STRW (GLOVE) ×3 IMPLANT
GLOVE SURG SS PI 7.0 STRL IVOR (GLOVE) ×3 IMPLANT
GOWN STRL REUS W/TWL XL LVL3 (GOWN DISPOSABLE) ×6 IMPLANT
HANDPIECE INTERPULSE COAX TIP (DISPOSABLE) ×2
HOLDER FOLEY CATH W/STRAP (MISCELLANEOUS) IMPLANT
JET LAVAGE IRRISEPT WOUND (IRRIGATION / IRRIGATOR) ×3
KIT TURNOVER KIT A (KITS) IMPLANT
LAVAGE JET IRRISEPT WOUND (IRRIGATION / IRRIGATOR) ×1 IMPLANT
NS IRRIG 1000ML POUR BTL (IV SOLUTION) ×3 IMPLANT
PACK TOTAL KNEE CUSTOM (KITS) ×3 IMPLANT
PATELLA MEDIAL ATTUN 35MM KNEE (Knees) ×3 IMPLANT
PENCIL SMOKE EVACUATOR (MISCELLANEOUS) IMPLANT
PIN DRILL FIX HALF THREAD (BIT) ×3 IMPLANT
PIN STEINMAN FIXATION KNEE (PIN) ×3 IMPLANT
PROTECTOR NERVE ULNAR (MISCELLANEOUS) ×3 IMPLANT
SET HNDPC FAN SPRY TIP SCT (DISPOSABLE) ×1 IMPLANT
SET PAD KNEE POSITIONER (MISCELLANEOUS) ×3 IMPLANT
SPONGE GAUZE 2X2 STER 10/PKG (GAUZE/BANDAGES/DRESSINGS) ×2
SPONGE LAP 18X18 RF (DISPOSABLE) IMPLANT
SPONGE SURGIFOAM ABS GEL 100 (HEMOSTASIS) ×3 IMPLANT
STOCKINETTE 6  STRL (DRAPES) ×2
STOCKINETTE 6 STRL (DRAPES) ×1 IMPLANT
SUT BONE WAX W31G (SUTURE) IMPLANT
SUT MNCRL AB 3-0 PS2 18 (SUTURE) ×3 IMPLANT
SUT VIC AB 1 CT1 27 (SUTURE) ×6
SUT VIC AB 1 CT1 27XBRD ANTBC (SUTURE) ×3 IMPLANT
SUT VIC AB 2-0 CT1 27 (SUTURE) ×6
SUT VIC AB 2-0 CT1 TAPERPNT 27 (SUTURE) ×3 IMPLANT
SUT VLOC 180 0 24IN GS25 (SUTURE) ×9 IMPLANT
SYR 50ML LL SCALE MARK (SYRINGE) IMPLANT
TAPE STRIPS DRAPE STRL (GAUZE/BANDAGES/DRESSINGS) ×3 IMPLANT
TIBIAL BASE ROT PLAT SZ 7 KNEE (Knees) ×3 IMPLANT
TRAY CATH 16FR W/PLASTIC CATH (SET/KITS/TRAYS/PACK) ×3 IMPLANT
WATER STERILE IRR 1000ML POUR (IV SOLUTION) ×6 IMPLANT
WRAP KNEE MAXI GEL POST OP (GAUZE/BANDAGES/DRESSINGS) ×3 IMPLANT
YANKAUER SUCT BULB TIP 10FT TU (MISCELLANEOUS) ×3 IMPLANT

## 2020-03-24 NOTE — Anesthesia Procedure Notes (Signed)
Anesthesia Regional Block: Adductor canal block   Pre-Anesthetic Checklist: ,, timeout performed, Correct Patient, Correct Site, Correct Laterality, Correct Procedure, Correct Position, site marked, Risks and benefits discussed, pre-op evaluation,  At surgeon's request and post-op pain management  Laterality: Left  Prep: Maximum Sterile Barrier Precautions used, chloraprep       Needles:  Injection technique: Single-shot  Needle Type: Echogenic Stimulator Needle     Needle Length: 9cm  Needle Gauge: 22     Additional Needles:   Procedures:,,,, ultrasound used (permanent image in chart),,,,  Narrative:  Start time: 03/24/2020 9:34 AM End time: 03/24/2020 9:37 AM Injection made incrementally with aspirations every 5 mL.  Performed by: Personally  Anesthesiologist: Kaylyn Layer, MD  Additional Notes: Risks, benefits, and alternative discussed. Patient gave consent for procedure. Patient prepped and draped in sterile fashion. Sedation administered, patient remains easily responsive to voice. Relevant anatomy identified with ultrasound guidance. Local anesthetic given in 5cc increments with no signs or symptoms of intravascular injection. No pain or paraesthesias with injection. Patient monitored throughout procedure with signs of LAST or immediate complications. Tolerated well. Ultrasound image placed in chart.  Robert Greenhouse, MD

## 2020-03-24 NOTE — Anesthesia Procedure Notes (Signed)
Procedure Name: MAC Date/Time: 03/24/2020 10:33 AM Performed by: Eben Burow, CRNA Pre-anesthesia Checklist: Patient identified, Emergency Drugs available, Suction available, Patient being monitored and Timeout performed Oxygen Delivery Method: Simple face mask Dental Injury: Teeth and Oropharynx as per pre-operative assessment

## 2020-03-24 NOTE — Transfer of Care (Signed)
Immediate Anesthesia Transfer of Care Note  Patient: Declan Mier  Procedure(s) Performed: TOTAL KNEE ARTHROPLASTY (Left Knee)  Patient Location: PACU  Anesthesia Type:Spinal  Level of Consciousness: awake, alert  and oriented  Airway & Oxygen Therapy: Patient Spontanous Breathing and Patient connected to face mask oxygen  Post-op Assessment: Report given to RN and Post -op Vital signs reviewed and stable  Post vital signs: Reviewed and stable  Last Vitals:  Vitals Value Taken Time  BP 101/56 03/24/20 1255  Temp    Pulse 73 03/24/20 1257  Resp 13 03/24/20 1257  SpO2 100 % 03/24/20 1257  Vitals shown include unvalidated device data.  Last Pain:  Vitals:   03/24/20 0820  TempSrc: Oral         Complications: No apparent anesthesia complications

## 2020-03-24 NOTE — Evaluation (Addendum)
Physical Therapy Evaluation Patient Details Name: Robert Esparza MRN: [id number] DOB: Aug 20, 1956 Today's Date: 03/24/2020   History of Present Illness  L TKA 03/24/20. PMH of HTN, DM2, CAD.  Clinical Impression  Pt is s/p TKA resulting in the deficits listed below (see PT Problem List). Pt ambulated 60' with RW, loss of balance x 1 requiring min A to recover. Initiated TKA HEP. Good progress expected.  Pt will benefit from skilled PT to increase their independence and safety with mobility to allow discharge to the venue listed below.      Follow Up Recommendations Follow surgeon's recommendation for DC plan and follow-up therapies    Equipment Recommendations  None recommended by PT    Recommendations for Other Services       Precautions / Restrictions  knee, fall precautions Reviewed no pillow under knee WBAT     Mobility  Bed Mobility Overal bed mobility: Modified Independent             General bed mobility comments: HOB up, used rail  Transfers Overall transfer level: Needs assistance Equipment used: Rolling walker (2 wheeled) Transfers: Sit to/from Stand Sit to Stand: Min assist;From elevated surface         General transfer comment: VCs hand placement, min A to power up  Ambulation/Gait Ambulation/Gait assistance: Min guard;Min assist Gait Distance (Feet): 45 Feet Assistive device: Rolling walker (2 wheeled) Gait Pattern/deviations: Step-to pattern;Decreased stride length;Decreased weight shift to left Gait velocity: decr   General Gait Details: VCs sequencing, mild LOB x 1 while turning required min A to recover  Stairs            Wheelchair Mobility    Modified Rankin (Stroke Patients Only)       Balance Overall balance assessment: Needs assistance   Sitting balance-Leahy Scale: Good     Standing balance support: Bilateral upper extremity supported Standing balance-Leahy Scale: Poor Standing balance comment: relies on BUE  support                             Pertinent Vitals/Pain Pain Assessment: 0-10 Pain Score: 1  Pain Location: L knee Pain Descriptors / Indicators: Sore Pain Intervention(s): Limited activity within patient's tolerance;Monitored during session;Ice applied    Home Living Family/patient expects to be discharged to:: Private residence Living Arrangements: Spouse/significant other Available Help at Discharge: Family   Home Access: Stairs to enter Entrance Stairs-Rails: (has post to hold on to) Technical brewer of Steps: 3 Home Layout: One level Home Equipment: [place] - 2 wheels;Bedside commode;Cane - single point      Prior Function Level of Independence: Independent               Hand Dominance        Extremity/Trunk Assessment   Upper Extremity Assessment Upper Extremity Assessment: Overall WFL for tasks assessed    Lower Extremity Assessment Lower Extremity Assessment: LLE deficits/detail LLE Deficits / Details: SLR 3/5, knee AAROM 0-50* LLE Sensation: decreased light touch(feet decr to light touch likely 2* spinal) LLE Coordination: WNL    Cervical / Trunk Assessment Cervical / Trunk Assessment: Normal  Communication   Communication: No difficulties  Cognition Arousal/Alertness: Awake/alert Behavior During Therapy: WFL for tasks assessed/performed Overall Cognitive Status: Within Functional Limits for tasks assessed  General Comments      Exercises Total Joint Exercises Ankle Circles/Pumps: AROM;Both;10 reps;Supine Heel Slides: AAROM;Left;Supine;15 reps Long Arc Quad: AROM;Left;10 reps;Seated Goniometric ROM: 0-50* AAROM L knee   Assessment/Plan    PT Assessment Patient needs continued PT services  PT Problem List Decreased strength;Decreased range of motion;Decreased activity tolerance;Decreased balance;Decreased mobility       PT Treatment Interventions Gait  training;DME instruction;Stair training;Functional mobility training;Therapeutic exercise;Therapeutic activities;Patient/family education    PT Goals (Current goals can be found in the Care Plan section)  Acute Rehab PT Goals Patient 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: "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" PT Goal Formulation: With patient/family Time For Goal Achievement: 03/31/20 Potential to Achieve Goals: Good    Frequency 7X/week   Barriers to discharge        Co-evaluation               AM-PAC PT "6 Clicks" Mobility  Outcome Measure Help needed turning from your back to your side while in a flat bed without using bedrails?: None Help needed moving from lying on your back to sitting on the side of a flat bed without using bedrails?: A Little Help needed moving to and from a bed to a chair (including a wheelchair)?: A Little Help needed standing up from a chair using your arms (e.g., wheelchair or bedside chair)?: A Little Help needed to walk in hospital room?: A Little Help needed climbing 3-5 steps with a railing? : A Lot 6 Click Score: 18    End of Session Equipment Utilized During Treatment: Gait belt Activity Tolerance: Patient tolerated treatment well;No increased pain Patient left: in chair;with call bell/phone within reach;with chair alarm set;with family/visitor present Nurse Communication: Mobility status PT Visit Diagnosis: Unsteadiness on feet (R26.81);Difficulty in walking, not elsewhere classified (R26.2);Muscle weakness (generalized) (M62.81)    Time: 6761-9509 PT Time Calculation (min) (ACUTE ONLY): 30 min   Charges:   PT Evaluation $PT Eval Low Complexity: 1 Low PT Treatments $Gait Training: 8-22 mins        Ralene Bathe Kistler PT 03/24/2020  Acute Rehabilitation Services Pager [phone] Office [phone]

## 2020-03-24 NOTE — Anesthesia Procedure Notes (Signed)
Spinal  Patient location during procedure: OR Start time: 03/24/2020 10:32 AM End time: 03/24/2020 10:35 AM Staffing Performed: anesthesiologist  Anesthesiologist: Kaylyn Layer, MD Preanesthetic Checklist Completed: patient identified, IV checked, risks and benefits discussed, surgical consent, monitors and equipment checked, pre-op evaluation and timeout performed Spinal Block Patient position: sitting Prep: DuraPrep and site prepped and draped Patient monitoring: continuous pulse ox, blood pressure and heart rate Approach: midline Location: L3-4 Injection technique: single-shot Needle Needle type: Pencan  Needle gauge: 24 G Needle length: 9 cm Additional Notes Risks, benefits, and alternative discussed. Patient gave consent to procedure. Prepped and draped in sitting position. Patient sedated but responsive to voice. Clear CSF obtained after one needle pass. Positive terminal aspiration. No pain or paraesthesias with injection. Patient tolerated procedure well. Vital signs stable. Amalia Greenhouse, MD

## 2020-03-24 NOTE — Progress Notes (Signed)
Assisted Dr. Howse with left, ultrasound guided, adductor canal block. Side rails up, monitors on throughout procedure. See vital signs in flow sheet. Tolerated Procedure well.  

## 2020-03-24 NOTE — Interval H&P Note (Signed)
History and Physical Interval Note:  03/24/2020 9:49 AM  Robert Esparza  has presented today for surgery, with the diagnosis of Left knee osteoarthritis.  The various methods of treatment have been discussed with the patient and family. After consideration of risks, benefits and other options for treatment, the patient has consented to  Procedure(s) with comments: TOTAL KNEE ARTHROPLASTY (Left) - adductor canal as a surgical intervention.  The patient's history has been reviewed, patient examined, no change in status, stable for surgery.  I have reviewed the patient's chart and labs.  Questions were answered to the patient's satisfaction.     Wilborn Membreno ANDREW

## 2020-03-24 NOTE — Op Note (Signed)
DATE OF SURGERY:  03/24/2020  TIME: 12:26 PM  PATIENT NAME:  Robert Esparza    AGE: 64 y.o.   PRE-OPERATIVE DIAGNOSIS:  Left knee osteoarthritis  POST-OPERATIVE DIAGNOSIS:  Left knee osteoarthritis  PROCEDURE:  Procedure(s): TOTAL KNEE ARTHROPLASTY  SURGEON:  Clay Menser ANDREW  ASSISTANT:  Leeanne Haus, PA-C, present and scrubbed throughout the case, critical for assistance with exposure, retraction, instrumentation, and closure.  OPERATIVE IMPLANTS: Depuy PFC Attune Rotating Platform.  Femur size 6, Tibia size 7, Patella size 35 3-peg oval button, with a 6 mm polyethylene insert.   PREOPERATIVE INDICATIONS:   [name] is a 64 y.o. year old male with end stage bone on bone arthritis of the knee who failed conservative treatment and elected for Total Knee Arthroplasty.   The risks, benefits, and alternatives were discussed at length including but not limited to the risks of infection, bleeding, nerve injury, stiffness, blood clots, the need for revision surgery, cardiopulmonary complications, among others, and they were willing to proceed.  OPERATIVE DESCRIPTION:  The patient was brought to the operative room and placed in a supine position.  Spinal anesthesia was administered.  IV antibiotics were given.  The lower extremity was prepped and draped in the usual sterile fashion.  Time out was performed.  The leg was elevated and exsanguinated and the tourniquet was inflated.  Anterior quadriceps tendon splitting approach was performed.  The patella was retracted and osteophytes were removed.  The anterior horn of the medial and lateral meniscus was removed and cruciate ligaments resected.   The distal femur was opened with the drill and the intramedullary distal femoral cutting jig was utilized, set at 5 degrees resecting 10 mm off the distal femur.  Care was taken to protect the collateral ligaments.  The distal femoral sizing jig was applied, taking care to avoid  notching.  Then the 4-in-1 cutting jig was applied and the anterior and posterior femur was cut, along with the chamfer cuts.    Then the extramedullary tibial cutting jig was utilized making the appropriate cut using the anterior tibial crest as a reference building in appropriate posterior slope.  Care was taken during the cut to protect the medial and collateral ligaments.  The proximal tibia was removed along with the posterior horns of the menisci.   The posterior medial femoral osteophytes and posterior lateral femoral osteophytes were removed.    The flexion gap was then measured and was symmetric with the extension gap, measured at 6.  I completed the distal femoral preparation using the appropriate jig to prepare the box.  The patella was then measured, and cut with the saw.    The proximal tibia sized and prepared accordingly with the reamer and the punch, and then all components were trialed with the trial insert.  The knee was found to have excellent balance and full motion.    The above named components were then cemented into place and all excess cement was removed.  The trial polyethylene component was in place during cementation, and then was exchanged for the real polyethylene component.    The knee was easily taken through a range of motion and the patella tracked well and the knee irrigated copiously and the parapatellar and subcutaneous tissue closed with vicryl, and monocryl with steri strips for the skin.  The arthrotomy was closed at 90 of flexion. The wounds were dressed with sterile gauze and the tourniquet released and the patient was awakened and returned to the PACU in stable and  satisfactory condition.  There were no complications.  Total tourniquet time was 86 minutes.                6

## 2020-03-25 LAB — GLUCOSE, CAPILLARY: Glucose-Capillary: 172 mg/dL — ABNORMAL HIGH (ref 70–99)

## 2020-03-25 NOTE — Discharge Summary (Signed)
Patient ID: Robert Esparza MRN: [id number] DOB/AGE: 1956-09-14 64 y.o.  Admit date: 03/24/2020 Discharge date:   Primary Diagnosis: Left knee OA  Admission Diagnoses:  Past Medical History:  Diagnosis Date  . Arthritis   . Coronary artery disease    Cardiac stent x1  . Diabetes mellitus without complication (HCC)    type 2  . History of kidney stones   . Hypertension    Discharge Diagnoses:   Active Problems:   Osteoarthritis of left knee  Estimated body mass index is 32.65 kg/m as calculated from the following:   Height as of this encounter: 5\' 6"  (1.676 m).   Weight as of this encounter: 91.8 kg.  Procedure:  Procedure(s) (LRB): TOTAL KNEE ARTHROPLASTY (Left)   Consults: None  HPI: Mr. Robert Esparza was admitted for operative management of his end-stage left knee osteoarthritis. Laboratory Data: Admission on 03/24/2020  Component Date Value Ref Range Status  . Prothrombin Time 03/24/2020 12.5  11.4 - 15.2 seconds Final  . INR 03/24/2020 0.9  0.8 - 1.2 Final   Comment: (NOTE) INR goal varies based on device and disease states. Performed at [facility], [address], [place], [place]   . aPTT 03/24/2020 26  24 - 36 seconds Final   Performed at [facility] [address] [place], [place]  . ABO/RH(D) 03/24/2020 AB NEG   Final  . Antibody Screen 03/24/2020 NEG   Final  . Sample Expiration 03/24/2020    Final                   Value:03/27/2020,2359 Performed at [facility], [address] [place], [place]   . Glucose-Capillary 03/24/2020 166* 70 - 99 mg/dL Final   Glucose reference range applies only to samples taken after fasting for at least 8 hours.  . Comment 1 03/24/2020 Notify RN   Final  . Comment 2 03/24/2020 Document in Chart   Final  . ABO/RH(D) 03/24/2020    Final                   Value:AB NEG Performed at [facility], [address]  [place], [place]   . HIV Screen 4th Generation wRfx 03/24/2020 NON REACTIVE  NON REACTIVE Final   Performed at [facility], [address] [place], [place]  . WBC 03/24/2020 8.1  4.0 - 10.5 K/uL Final  . RBC 03/24/2020 4.19* 4.22 - 5.81 MIL/uL Final  . Hemoglobin 03/24/2020 13.6  13.0 - 17.0 g/dL Final  . HCT 03/26/2020 39.5  39.0 - 52.0 % Final  . MCV 03/24/2020 94.3  80.0 - 100.0 fL Final  . MCH 03/24/2020 32.5  26.0 - 34.0 pg Final  . MCHC 03/24/2020 34.4  30.0 - 36.0 g/dL Final  . RDW 03/26/2020 11.6  11.5 - 15.5 % Final  . Platelets 03/24/2020 198  150 - 400 K/uL Final  . nRBC 03/24/2020 0.0  0.0 - 0.2 % Final   Performed at [facility], [address], [place], [place]  . Creatinine, Ser 03/24/2020 0.95  0.61 - 1.24 mg/dL Final  . GFR calc non Af Amer 03/24/2020 >60  >60 mL/min Final  . GFR calc Af Amer 03/24/2020 >60  >60 mL/min Final   Performed at [facility], [address], [place], [place]  . Glucose-Capillary 03/24/2020 159* 70 - 99 mg/dL Final  Glucose reference range applies only to samples taken after fasting for at least 8 hours.  . Sodium 03/24/2020 135  135 - 145 mmol/L Final  . Potassium 03/24/2020 5.8* 3.5 - 5.1 mmol/L Final  . Chloride 03/24/2020 101  98 - 111 mmol/L Final  . CO2 03/24/2020 26  22 - 32 mmol/L Final  . Glucose, Bld 03/24/2020 175* 70 - 99 mg/dL Final   Glucose reference range applies only to samples taken after fasting for at least 8 hours.  . BUN 03/24/2020 19  8 - 23 mg/dL Final  . Creatinine, Ser 03/24/2020 0.96  0.61 - 1.24 mg/dL Final  . Calcium 03/24/2020 8.9  8.9 - 10.3 mg/dL Final  . Total Protein 03/24/2020 6.5  6.5 - 8.1 g/dL Final  . Albumin 03/24/2020 3.7  3.5 - 5.0 g/dL Final  . AST 03/24/2020 24  15 - 41 U/L Final  . ALT 03/24/2020 34  0 - 44 U/L Final  . Alkaline Phosphatase 03/24/2020 29* 38 - 126 U/L Final  . Total Bilirubin 03/24/2020 1.2  0.3 -  1.2 mg/dL Final  . GFR calc non Af Amer 03/24/2020 >60  >60 mL/min Final  . GFR calc Af Amer 03/24/2020 >60  >60 mL/min Final  . Anion gap 03/24/2020 8  5 - 15 Final   Performed at [facility], [place] [address] [place], [place] [address]  . Glucose-Capillary 03/24/2020 269* 70 - 99 mg/dL Final   Glucose reference range applies only to samples taken after fasting for at least 8 hours.  . Glucose-Capillary 03/24/2020 250* 70 - 99 mg/dL Final   Glucose reference range applies only to samples taken after fasting for at least 8 hours.  . Glucose-Capillary 03/25/2020 172* 70 - 99 mg/dL Final   Glucose reference range applies only to samples taken after fasting for at least 8 hours.  Hospital Outpatient Visit on 03/21/2020  Component Date Value Ref Range Status  . SARS Coronavirus 2 03/21/2020 NEGATIVE  NEGATIVE Final   Comment: (NOTE) SARS-CoV-2 target nucleic acids are NOT DETECTED. The SARS-CoV-2 RNA is generally detectable in upper and lower respiratory specimens during the acute phase of infection. Negative results do not preclude SARS-CoV-2 infection, do not rule out co-infections with other pathogens, and should not be used as the sole basis for treatment or other patient management decisions. Negative results must be combined with clinical observations, patient history, and epidemiological information. The expected result is Negative. Fact Sheet for Patients: SugarRoll.be Fact Sheet for Healthcare Providers: https://www.woods-mathews.com/ This test is not yet approved or cleared by the [place] FDA and  has been authorized for detection and/or diagnosis of SARS-CoV-2 by FDA under an Emergency Use Authorization (EUA). This EUA will remain  in effect (meaning this test can be used) for the duration of the COVID-19 declaration under Section 56                          4(b)(1) of the Act, 21 U.S.C. section 360bbb-3(b)(1),  unless the authorization is terminated or revoked sooner. Performed at [facility], [place] [address] [place], [place] [address]   Hospital Outpatient Visit on 03/17/2020  Component Date Value Ref Range Status  . MRSA, PCR 03/17/2020 NEGATIVE  NEGATIVE Final  . Staphylococcus aureus 03/17/2020 NEGATIVE  NEGATIVE Final   Comment: (NOTE) The Xpert SA Assay (FDA approved for NASAL specimens in patients 52 years of age and older), is one component of  a comprehensive surveillance program. It is not intended to diagnose infection nor to guide or monitor treatment. Performed at [facility], [address], [place], [place] [address]   . Sodium 03/17/2020 135  135 - 145 mmol/L Final  . Potassium 03/17/2020 4.6  3.5 - 5.1 mmol/L Final  . Chloride 03/17/2020 101  98 - 111 mmol/L Final  . CO2 03/17/2020 23  22 - 32 mmol/L Final  . Glucose, Bld 03/17/2020 158* 70 - 99 mg/dL Final   Glucose reference range applies only to samples taken after fasting for at least 8 hours.  . BUN 03/17/2020 18  8 - 23 mg/dL Final  . Creatinine, Ser 03/17/2020 0.81  0.61 - 1.24 mg/dL Final  . Calcium 60/45/4098 9.5  8.9 - 10.3 mg/dL Final  . GFR calc non Af Amer 03/17/2020 >60  >60 mL/min Final  . GFR calc Af Amer 03/17/2020 >60  >60 mL/min Final  . Anion gap 03/17/2020 11  5 - 15 Final   Performed at [facility], [address] [place], [place] [address]  . WBC 03/17/2020 8.3  4.0 - 10.5 K/uL Final  . RBC 03/17/2020 4.53  4.22 - 5.81 MIL/uL Final  . Hemoglobin 03/17/2020 14.9  13.0 - 17.0 g/dL Final  . HCT 78/29/5621 42.6  39.0 - 52.0 % Final  . MCV 03/17/2020 94.0  80.0 - 100.0 fL Final  . MCH 03/17/2020 32.9  26.0 - 34.0 pg Final  . MCHC 03/17/2020 35.0  30.0 - 36.0 g/dL Final  . RDW 30/86/5784 11.7  11.5 - 15.5 % Final  . Platelets 03/17/2020 217  150 - 400 K/uL Final  . nRBC 03/17/2020 0.0  0.0 - 0.2 % Final   Performed at [facility], [address] [place], [place] [address]  . Glucose-Capillary 03/17/2020 137* 70 - 99 mg/dL Final   Glucose reference range applies only to samples taken after fasting for at least 8 hours.     X-Rays:No results found.  EKG:No orders found for this or any previous visit.   Hospital Course: Vineeth Fell is a 64 y.o. who was admitted to Hospital. They were brought to the operating room on 03/24/2020 and underwent Procedure(s): TOTAL KNEE ARTHROPLASTY.  Patient tolerated the procedure well and was later transferred to the recovery room and then to the orthopaedic floor for postoperative care.  They were given PO and IV analgesics for pain control following their surgery.  They were given 24 hours of postoperative antibiotics of  Anti-infectives (From admission, onward)   Start     Dose/Rate Route Frequency Ordered Stop   03/24/20 1800  ceFAZolin (ANCEF) IVPB 1 g/50 mL premix     1 g 100 mL/hr over 30 Minutes Intravenous Every 8 hours 03/24/20 1013 03/25/20 2159   03/24/20 0815  ceFAZolin (ANCEF) IVPB 2g/100 mL premix     2 g 200 mL/hr over 30 Minutes Intravenous On call to O.R. 03/24/20 0800 03/24/20 1036     and started on DVT prophylaxis in the form of Lovenox.   PT and OT were ordered for total joint protocol.  Discharge planning consulted to help with postop disposition and equipment needs.  Patient had a good night on the evening of surgery.  They started to get up OOB with therapy on day one. Hemovac drain was pulled without difficulty.  Continued to work with therapy,  Incision was healing well.  Patient was seen in rounds and was ready  to go home.   Diet: Regular diet Activity:WBAT Follow-up:in 10 days Disposition - Home Discharged Condition: good   Discharge Instructions    Call MD / Call 911   Complete by: As directed    If you experience chest pain or shortness of breath, CALL 911 and be transported to the hospital emergency room.  If you develope a fever above 101 F, pus  (white drainage) or increased drainage or redness at the wound, or calf pain, call your surgeon's office.   Call MD / Call 911   Complete by: As directed    If you experience chest pain or shortness of breath, CALL 911 and be transported to the hospital emergency room.  If you develope a fever above 101 F, pus (white drainage) or increased drainage or redness at the wound, or calf pain, call your surgeon's office.   Constipation Prevention   Complete by: As directed    Drink plenty of fluids.  Prune juice may be helpful.  You may use a stool softener, such as Colace (over the counter) 100 mg twice a day.  Use MiraLax (over the counter) for constipation as needed.   Constipation Prevention   Complete by: As directed    Drink plenty of fluids.  Prune juice may be helpful.  You may use a stool softener, such as Colace (over the counter) 100 mg twice a day.  Use MiraLax (over the counter) for constipation as needed.   Diet - low sodium heart healthy   Complete by: As directed    Discharge instructions   Complete by: As directed    Dr. Eugenia Mcalpineobert [facility] [address] [place], [place] [address] [phone]  TOTAL KNEE REPLACEMENT POSTOPERATIVE DIRECTIONS  Knee Rehabilitation, Guidelines Following Surgery  Results after knee surgery are often greatly improved when you follow the exercise, range of motion and muscle strengthening exercises prescribed by your doctor. Safety measures are also important to protect the knee from further injury. Any time any of these exercises cause you to have increased pain or swelling in your knee joint, decrease the amount until you are comfortable again and slowly increase them. If you have problems or questions, call your caregiver or physical therapist for advice.   HOME CARE INSTRUCTIONS  Remove items at home which could result in a fall. This includes throw rugs or furniture in walking pathways.  ICE to the affected knee every three  hours for 30 minutes at a time and then as needed for pain and swelling.  Continue to use ice on the knee for pain and swelling from surgery. You may notice swelling that will progress down to the foot and ankle.  This is normal after surgery.  Elevate the leg when you are not up walking on it.   Continue to use the breathing machine which will help keep your temperature down.  It is common for your temperature to cycle up and down following surgery, especially at night when you are not up moving around and exerting yourself.  The breathing machine keeps your lungs expanded and your temperature down. Do not place pillow under knee, focus on keeping the knee straight while resting   DIET You may resume your previous home diet once your are discharged from the hospital.  DRESSING / WOUND CARE / SHOWERING Keep the surgical dressing until follow up.  The dressing is water proof, but you need to put extra covering over it like plastic wrap.  IF THE  DRESSING FALLS OFF or the wound gets wet inside, change the dressing with sterile gauze.  Please use good hand washing techniques before changing the dressing.  Do not use any lotions or creams on the incision until instructed by your surgeon.   You may start showering once you are discharged home but do not submerge the incision under water.  You are sent home with an ACE bandage on over the leg, this can be removed at 3 days from surgery. At this time you can start showering. Please place the white TED stocking on the surgical leg after. This needs to be worn on the surgical leg for 2 weeks after surgery.   ACTIVITY Walk with your walker as instructed. Use walker as long as suggested by your caregivers. Avoid periods of inactivity such as sitting longer than an hour when not asleep. This helps prevent blood clots.  You may resume a sexual relationship in one month or when given the OK by your doctor.  You may return to work once you are cleared by your  doctor.  Do not drive a car for 6 weeks or until released by you surgeon.  Do not drive while taking narcotics.  WEIGHT BEARING Weight bearing as tolerated with assist device (walker, cane, etc) as directed, use it as long as suggested by your surgeon or therapist, typically at least 4-6 weeks.  POSTOPERATIVE CONSTIPATION PROTOCOL Constipation - defined medically as fewer than three stools per week and severe constipation as less than one stool per week.  One of the most common issues patients have following surgery is constipation.  Even if you have a regular bowel pattern at home, your normal regimen is likely to be disrupted due to multiple reasons following surgery.  Combination of anesthesia, postoperative narcotics, change in appetite and fluid intake all can affect your bowels.  In order to avoid complications following surgery, here are some recommendations in order to help you during your recovery period.  Colace (docusate) - Pick up an over-the-counter form of Colace or another stool softener and take twice a day as long as you are requiring postoperative pain medications.  Take with a full glass of water daily.  If you experience loose stools or diarrhea, hold the colace until you stool forms back up.  If your symptoms do not get better within 1 week or if they get worse, check with your doctor.  Dulcolax (bisacodyl) - Pick up over-the-counter and take as directed by the product packaging as needed to assist with the movement of your bowels.  Take with a full glass of water.  Use this product as needed if not relieved by Colace only.   MiraLax (polyethylene glycol) - Pick up over-the-counter to have on hand.  MiraLax is a solution that will increase the amount of water in your bowels to assist with bowel movements.  Take as directed and can mix with a glass of water, juice, soda, coffee, or tea.  Take if you go more than two days without a movement. Do not use MiraLax more than once per  day. Call your doctor if you are still constipated or irregular after using this medication for 7 days in a row.  If you continue to have problems with postoperative constipation, please contact the office for further assistance and recommendations.  If you experience "the worst abdominal pain ever" or develop nausea or vomiting, please contact the office immediatly for further recommendations for treatment.  ITCHING  If you experience itching  with your medications, try taking only a single pain pill, or even half a pain pill at a time.  You can also use Benadryl over the counter for itching or also to help with sleep.   TED HOSE STOCKINGS Wear the elastic stockings on both legs for two weeks following surgery during the day but you may remove then at night for sleeping.  Okay to remove ACE in 3 days, put TED on after  MEDICATIONS See your medication summary on the "After Visit Summary" that the nursing staff will review with you prior to discharge.  You may have some home medications which will be placed on hold until you complete the course of blood thinner medication.  It is important for you to complete the blood thinner medication as prescribed by your surgeon.  Continue your approved medications as instructed at time of discharge. If you were not previously taking any blood thinners prior to surgery please start taking Aspirin 325 mg tabs twice daily for 6 weeks. If you are unable to take Aspirin please let your doctor know.   PRECAUTIONS If you experience chest pain or shortness of breath - call 911 immediately for transfer to the hospital emergency department.  If you develop a fever greater that 101 F, purulent drainage from wound, increased redness or drainage from wound, foul odor from the wound/dressing, or calf pain - CONTACT YOUR SURGEON.                                                   FOLLOW-UP APPOINTMENTS Make sure you keep all of your appointments after your operation with  your surgeon and caregivers. You should call the office at the above phone number and make an appointment for approximately two weeks after the date of your surgery or on the date instructed by your surgeon outlined in the "After Visit Summary".   RANGE OF MOTION AND STRENGTHENING EXERCISES  Rehabilitation of the knee is important following a knee injury or an operation. After just a few days of immobilization, the muscles of the thigh which control the knee become weakened and shrink (atrophy). Knee exercises are designed to build up the tone and strength of the thigh muscles and to improve knee motion. Often times heat used for twenty to thirty minutes before working out will loosen up your tissues and help with improving the range of motion but do not use heat for the first two weeks following surgery. These exercises can be done on a training (exercise) mat, on the floor, on a table or on a bed. Use what ever works the best and is most comfortable for you Knee exercises include:  Leg Lifts - While your knee is still immobilized in a splint or cast, you can do straight leg raises. Lift the leg to 60 degrees, hold for 3 sec, and slowly lower the leg. Repeat 10-20 times 2-3 times daily. Perform this exercise against resistance later as your knee gets better.  Quad and Hamstring Sets - Tighten up the muscle on the front of the thigh (Quad) and hold for 5-10 sec. Repeat this 10-20 times hourly. Hamstring sets are done by pushing the foot backward against an object and holding for 5-10 sec. Repeat as with quad sets.  Leg Slides: Lying on your back, slowly slide your foot toward your buttocks, bending your  knee up off the floor (only go as far as is comfortable). Then slowly slide your foot back down until your leg is flat on the floor again. Angel Wings: Lying on your back spread your legs to the side as far apart as you can without causing discomfort.  A rehabilitation program following serious knee injuries  can speed recovery and prevent re-injury in the future due to weakened muscles. Contact your doctor or a physical therapist for more information on knee rehabilitation.   IF YOU ARE TRANSFERRED TO A SKILLED REHAB FACILITY If the patient is transferred to a skilled rehab facility following release from the hospital, a list of the current medications will be sent to the facility for the patient to continue.  When discharged from the skilled rehab facility, please have the facility set up the patient's Home Health Physical Therapy prior to being released. Also, the skilled facility will be responsible for providing the patient with their medications at time of release from the facility to include their pain medication, the muscle relaxants, and their blood thinner medication. If the patient is still at the rehab facility at time of the two week follow up appointment, the skilled rehab facility will also need to assist the patient in arranging follow up appointment in our office and any transportation needs.  MAKE SURE YOU:  Understand these instructions.  Get help right away if you are not doing well or get worse.    Pick up stool softner and laxative for home use following surgery while on pain medications. May shower starting three days after surgery. Please use a clean towel to pat the leg dry following showers. Continue to use ice for pain and swelling after surgery. Do not use any lotions or creams on the incision until instructed by you Start Aspirin immediately following surgery.   Do not put a pillow under the knee. Place it under the heel.   Complete by: As directed    Driving restrictions   Complete by: As directed    No driving for two weeks   Increase activity slowly as tolerated   Complete by: As directed    TED hose   Complete by: As directed    Use stockings (TED hose) for three weeks on both leg(s).  You may remove them at night for sleeping.   Weight bearing as tolerated    Complete by: As directed      Allergies as of 03/25/2020   No Known Allergies     Medication List    STOP taking these medications   aspirin EC 81 MG tablet     TAKE these medications   atorvastatin 80 MG tablet Commonly known as: LIPITOR Take 80 mg by mouth daily.   lisinopril 40 MG tablet Commonly known as: ZESTRIL Take 40 mg by mouth daily.   metFORMIN 500 MG tablet Commonly known as: GLUCOPHAGE Take 1,000 mg by mouth 2 (two) times daily with a meal.   methocarbamol 500 MG tablet Commonly known as: Robaxin Take 1 tablet (500 mg total) by mouth 4 (four) times daily.   metoprolol succinate 25 MG 24 hr tablet Commonly known as: TOPROL-XL Take 25 mg by mouth daily.   ondansetron 4 MG tablet Commonly known as: Zofran Take 1 tablet (4 mg total) by mouth daily as needed for nausea or vomiting.   oxyCODONE 5 MG immediate release tablet Commonly known as: Roxicodone Take 1 tablet (5 mg total) by mouth every 8 (eight) hours as needed.  Discharge Care Instructions  (From admission, onward)         Start     Ordered   03/24/20 0000  Weight bearing as tolerated     03/24/20 1401           Signed: Maryan Rued, MD Orthopaedic Surgery 03/25/2020, 8:46 AM

## 2020-03-25 NOTE — TOC Progression Note (Signed)
Transition of Care [facility]) - Progression Note    Patient Details  Name: Robert Esparza MRN: [id number] Date of Birth: Feb 16, 1956  Transition of [facility] CM/SW Contact  Armanda Heritage, RN Phone Number: 03/25/2020, 1:54 PM  Clinical Narrative:  CM spoke with patient at bedside. Referral made to [facility] for HHPT needs. Patient has [employer] and [facility] Ivan will be arranging [facility] agency for patient. All required documents faxed to [facility] at [phone].  Patient reports he has all dme at home.      Expected Discharge Plan: Home w Home Health Services Barriers to Discharge: No Barriers Identified  Expected Discharge Plan and Services Expected Discharge Plan: Home w Home Health Services   Discharge Planning Services: CM Consult   Living arrangements for the past 2 months: Single Family Home Expected Discharge Date: 03/25/20               DME Arranged: N/A DME Agency: NA       HH Arranged: PT HH Agency: Other - See comment([facility] referral made)         Social Determinants of Health (SDOH) Interventions    Readmission Risk Interventions No flowsheet data found.

## 2020-03-25 NOTE — Progress Notes (Signed)
   Subjective:  Patient reports pain as mild to moderate.  States that he had a really nice night.  He certainly has some pain but no acute events.  Pain well controlled.  No nausea vomiting or chest pain.  Objective:   VITALS:   Vitals:   03/24/20 1736 03/24/20 2214 03/25/20 0141 03/25/20 0634  BP: 139/76 134/72 (!) 144/67 (!) 149/66  Pulse: 70 70 62 (!) 55  Resp: 17 16 14 16   Temp: (!) 96.3 F (35.7 C) 98.7 F (37.1 C) 97.8 F (36.6 C) 97.7 F (36.5 C)  TempSrc:  Oral Oral Oral  SpO2: 96% 97% 98% 98%  Weight:      Height:        Neurovascular intact Sensation intact distally Intact pulses distally Dorsiflexion/Plantar flexion intact Incision: dressing C/D/I Compartment soft Hemovac pulled   Lab Results  Component Value Date   WBC 8.1 03/24/2020   HGB 13.6 03/24/2020   HCT 39.5 03/24/2020   MCV 94.3 03/24/2020   PLT 198 03/24/2020   BMET    Component Value Date/Time   NA 135 03/24/2020 1306   K 5.8 (H) 03/24/2020 1306   CL 101 03/24/2020 1306   CO2 26 03/24/2020 1306   GLUCOSE 175 (H) 03/24/2020 1306   BUN 19 03/24/2020 1306   CREATININE 0.95 03/24/2020 1306   CREATININE 0.96 03/24/2020 1306   CALCIUM 8.9 03/24/2020 1306   GFRNONAA >60 03/24/2020 1306   GFRNONAA >60 03/24/2020 1306   GFRAA >60 03/24/2020 1306   GFRAA >60 03/24/2020 1306     Assessment/Plan: 1 Day Post-Op   Active Problems:   Osteoarthritis of left knee   Advance diet Up with therapy  -He did very well with his therapy session yesterday.  Anticipate discharge home later today once he clears stairs.  -Follow-up with Dr. 03/26/2020 2 weeks postop.   Thomasena Edis 03/25/2020, 8:45 AM   03/27/2020, MD [phone]

## 2020-03-25 NOTE — Progress Notes (Signed)
Patient provided dc instructions; dc instructions explained in detail to patient; patient verbalizes understanding; Patient escorted to lobby in wheel chair to be discharged home with spouse.

## 2020-03-25 NOTE — Progress Notes (Signed)
Physical Therapy Treatment Patient Details Name: Robert Esparza MRN: [id number] DOB: March 23, 1956 Today's Date: 03/25/2020    History of Present Illness L TKA 03/24/20. PMH of HTN, DM2, CAD.    PT Comments    Pt is POD # 1 and is progressing well.  Pt demonstrates safe gait & transfers in order to return home from PT perspective once discharged by MD.  While in hospital, will continue to benefit from PT for skilled therapy to advance mobility and exercises.  Pt was able to ambulate 200' with RW and supervision and perform stairs with min guard for safety.  Required cues and educated on exercise and transfer technique.  Performed AAROM exercises with use of gait belt to assist.      Follow Up Recommendations  Follow surgeon's recommendation for DC plan and follow-up therapies;Supervision/Assistance - 24 hour     Equipment Recommendations  None recommended by PT    Recommendations for Other Services       Precautions / Restrictions Precautions Precautions: Fall;Knee Restrictions Other Position/Activity Restrictions: WBAT    Mobility  Bed Mobility Overal bed mobility: Modified Independent             General bed mobility comments: HOB up  Transfers Overall transfer level: Needs assistance Equipment used: Rolling walker (2 wheeled) Transfers: Sit to/from Stand Sit to Stand: Supervision         General transfer comment: initial verbal cues for hand placement; on subsequent sit to stand no cues needed  Ambulation/Gait Ambulation/Gait assistance: Supervision Gait Distance (Feet): 200 Feet Assistive device: Rolling walker (2 wheeled) Gait Pattern/deviations: Decreased stride length;Decreased weight shift to left Gait velocity: decr   General Gait Details: step to and cued to progress to Partial step through pattern; cues for RW proximity; steady no LOB   Stairs Stairs: Yes Stairs assistance: Min guard Stair Management: One rail Left;Step to pattern Number of  Stairs: 5     Wheelchair Mobility    Modified Rankin (Stroke Patients Only)       Balance Overall balance assessment: Needs assistance Sitting-balance support: No upper extremity supported Sitting balance-Leahy Scale: Normal     Standing balance support: No upper extremity supported;During functional activity Standing balance-Leahy Scale: Good Standing balance comment: able to pull up underwear in standing without UE support                            Cognition Arousal/Alertness: Awake/alert Behavior During Therapy: WFL for tasks assessed/performed Overall Cognitive Status: Within Functional Limits for tasks assessed                                        Exercises Total Joint Exercises Ankle Circles/Pumps: AROM;Both;10 reps;Supine Quad Sets: AROM;Supine;Both;10 reps Towel Squeeze: AROM;Both;10 reps;Supine Heel Slides: AAROM;Left;10 reps;Supine Hip ABduction/ADduction: AAROM;Left;10 reps;Supine Straight Leg Raises: AROM;Left;10 reps;Supine Long Arc Quad: AROM;Left;10 reps;Seated Knee Flexion: AROM;Left;10 reps;Seated Goniometric ROM: 0-80 L knee ROM    General Comments General comments (skin integrity, edema, etc.): Pt was educated on no pivots, resting with leg straight (no pillow under knee), car transfers, stairs, HEP, safe ice use, recommendation to move every 1-2 hours, in addition to gait and mobility.      Pertinent Vitals/Pain Pain Assessment: 0-10 Pain Score: 6  Pain Location: L knee Pain Descriptors / Indicators: Sore Pain Intervention(s): Limited activity within patient's tolerance;Monitored during session;Premedicated before  session;Repositioned;Ice applied    Home Living                      Prior Function            PT Goals (current goals can now be found in the care plan section) Progress towards PT goals: Progressing toward goals    Frequency    7X/week      PT Plan Current plan remains  appropriate    Co-evaluation              AM-PAC PT "6 Clicks" Mobility   Outcome Measure  Help needed turning from your back to your side while in a flat bed without using bedrails?: None Help needed moving from lying on your back to sitting on the side of a flat bed without using bedrails?: None Help needed moving to and from a bed to a chair (including a wheelchair)?: None Help needed standing up from a chair using your arms (e.g., wheelchair or bedside chair)?: None Help needed to walk in hospital room?: None Help needed climbing 3-5 steps with a railing? : None 6 Click Score: 24    End of Session Equipment Utilized During Treatment: Gait belt Activity Tolerance: Patient tolerated treatment well;No increased pain Patient left: in chair;with call bell/phone within reach;with chair alarm set Nurse Communication: Mobility status PT Visit Diagnosis: Unsteadiness on feet (R26.81);Difficulty in walking, not elsewhere classified (R26.2);Muscle weakness (generalized) (M62.81)     Time: 8185-6314 PT Time Calculation (min) (ACUTE ONLY): 40 min  Charges:  $Gait Training: 8-22 mins $Therapeutic Exercise: 8-22 mins $Therapeutic Activity: 8-22 mins                     Royetta Asal, PT Acute Rehab Services Pager [phone] [facility] [phone] [facility] [phone]    Rayetta Humphrey 03/25/2020, 10:40 AM

## 2020-03-27 ENCOUNTER — Encounter: Payer: Self-pay | Admitting: *Deleted

## 2020-03-27 NOTE — Anesthesia Postprocedure Evaluation (Signed)
Anesthesia Post Note  Patient: Robert Esparza  Procedure(s) Performed: TOTAL KNEE ARTHROPLASTY (Left Knee)     Patient location during evaluation: PACU Anesthesia Type: Spinal Level of consciousness: awake and alert and oriented Pain management: pain level controlled Vital Signs Assessment: post-procedure vital signs reviewed and stable Respiratory status: spontaneous breathing, nonlabored ventilation and respiratory function stable Cardiovascular status: blood pressure returned to baseline Postop Assessment: no apparent nausea or vomiting, spinal receding, no headache and no backache Anesthetic complications: no                 Kaylyn Layer
